# Patient Record
Sex: Female | Born: 1980
Health system: Southern US, Community
[De-identification: ages and names within clinical notes are randomized; demographics above are authoritative.]

## PROBLEM LIST (undated history)

## (undated) DIAGNOSIS — M5136 Other intervertebral disc degeneration, lumbar region: Secondary | ICD-10-CM

## (undated) DIAGNOSIS — G8929 Other chronic pain: Secondary | ICD-10-CM

## (undated) DIAGNOSIS — M549 Dorsalgia, unspecified: Secondary | ICD-10-CM

## (undated) DIAGNOSIS — M51369 Other intervertebral disc degeneration, lumbar region without mention of lumbar back pain or lower extremity pain: Secondary | ICD-10-CM

## (undated) HISTORY — PX: TUBAL LIGATION: SHX77

---

## 2001-12-03 ENCOUNTER — Inpatient Hospital Stay (HOSPITAL_COMMUNITY): Admission: AD | Admit: 2001-12-03 | Discharge: 2001-12-03 | Payer: Self-pay | Admitting: Obstetrics and Gynecology

## 2001-12-29 ENCOUNTER — Inpatient Hospital Stay (HOSPITAL_COMMUNITY): Admission: AD | Admit: 2001-12-29 | Discharge: 2001-12-29 | Payer: Self-pay | Admitting: Obstetrics and Gynecology

## 2002-01-02 ENCOUNTER — Inpatient Hospital Stay (HOSPITAL_COMMUNITY): Admission: AD | Admit: 2002-01-02 | Discharge: 2002-01-05 | Payer: Self-pay | Admitting: Obstetrics and Gynecology

## 2002-01-12 ENCOUNTER — Inpatient Hospital Stay (HOSPITAL_COMMUNITY): Admission: AD | Admit: 2002-01-12 | Discharge: 2002-01-12 | Payer: Self-pay | Admitting: Obstetrics and Gynecology

## 2002-10-19 ENCOUNTER — Other Ambulatory Visit: Admission: RE | Admit: 2002-10-19 | Discharge: 2002-10-19 | Payer: Self-pay | Admitting: Obstetrics and Gynecology

## 2003-11-06 ENCOUNTER — Other Ambulatory Visit: Admission: RE | Admit: 2003-11-06 | Discharge: 2003-11-06 | Payer: Self-pay | Admitting: Obstetrics and Gynecology

## 2004-05-11 ENCOUNTER — Other Ambulatory Visit: Admission: RE | Admit: 2004-05-11 | Discharge: 2004-05-11 | Payer: Self-pay | Admitting: Obstetrics and Gynecology

## 2005-06-22 ENCOUNTER — Other Ambulatory Visit: Admission: RE | Admit: 2005-06-22 | Discharge: 2005-06-22 | Payer: Self-pay | Admitting: Obstetrics and Gynecology

## 2005-06-25 ENCOUNTER — Ambulatory Visit (HOSPITAL_COMMUNITY): Admission: RE | Admit: 2005-06-25 | Discharge: 2005-06-25 | Payer: Self-pay | Admitting: Obstetrics and Gynecology

## 2005-07-02 ENCOUNTER — Inpatient Hospital Stay (HOSPITAL_COMMUNITY): Admission: AD | Admit: 2005-07-02 | Discharge: 2005-07-02 | Payer: Self-pay | Admitting: Obstetrics and Gynecology

## 2005-08-16 ENCOUNTER — Encounter: Admission: RE | Admit: 2005-08-16 | Discharge: 2005-08-16 | Payer: Self-pay | Admitting: Obstetrics and Gynecology

## 2005-11-18 ENCOUNTER — Inpatient Hospital Stay (HOSPITAL_COMMUNITY): Admission: AD | Admit: 2005-11-18 | Discharge: 2005-11-18 | Payer: Self-pay | Admitting: Obstetrics and Gynecology

## 2005-11-21 ENCOUNTER — Inpatient Hospital Stay (HOSPITAL_COMMUNITY): Admission: AD | Admit: 2005-11-21 | Discharge: 2005-11-21 | Payer: Self-pay | Admitting: Obstetrics and Gynecology

## 2005-11-24 IMAGING — US US RENAL
1 series · 14 of 25 positions shown · non-contrast
Comparison: none

CLINICAL DATA: Left flank pain. 
 RENAL/URINARY TRACT ULTRASOUND:
TECHNIQUE: Complete ultrasound examination of the urinary tract was performed including evaluation of the kidneys, renal collecting systems, and urinary bladder.

[Series 1: unknown · 0.19mm/px · 14 of 29 slices shown]
[im 1/29]
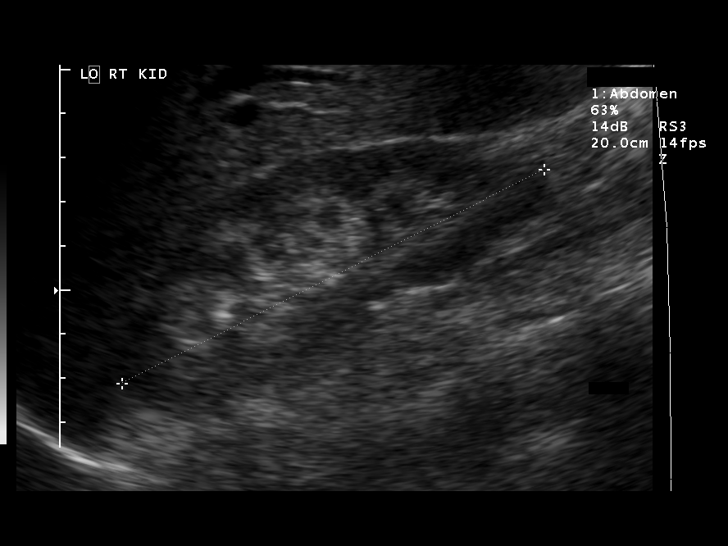
[im 3/29]
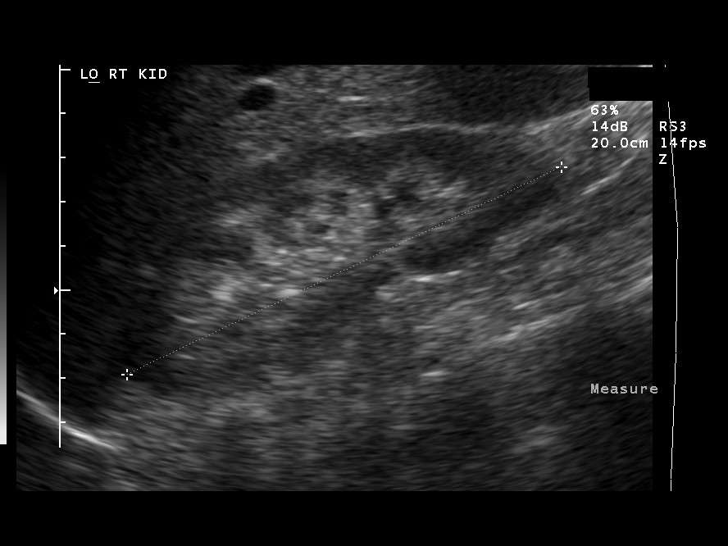
[im 5/29]
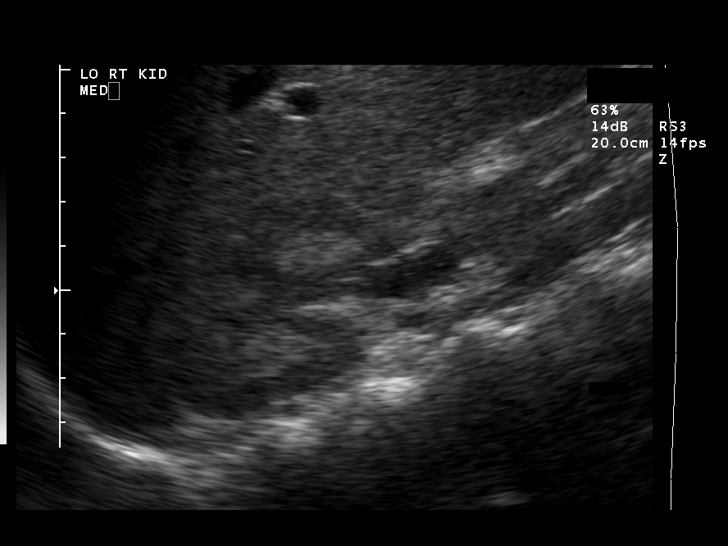
[im 8/29]
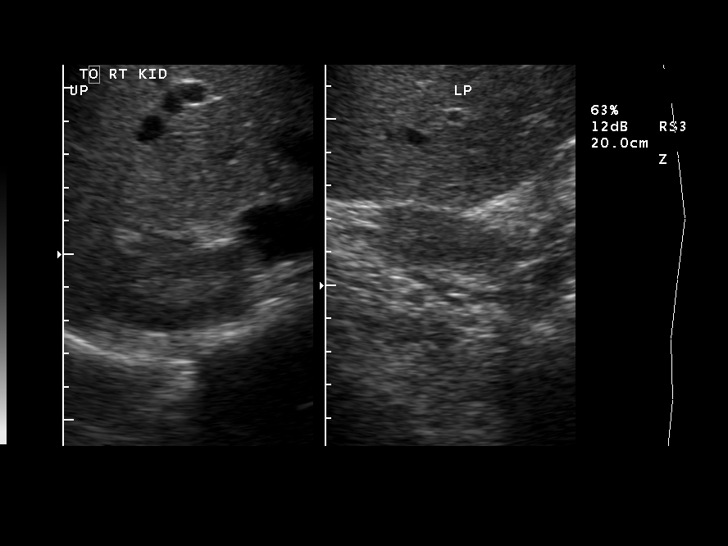
[im 10/29]
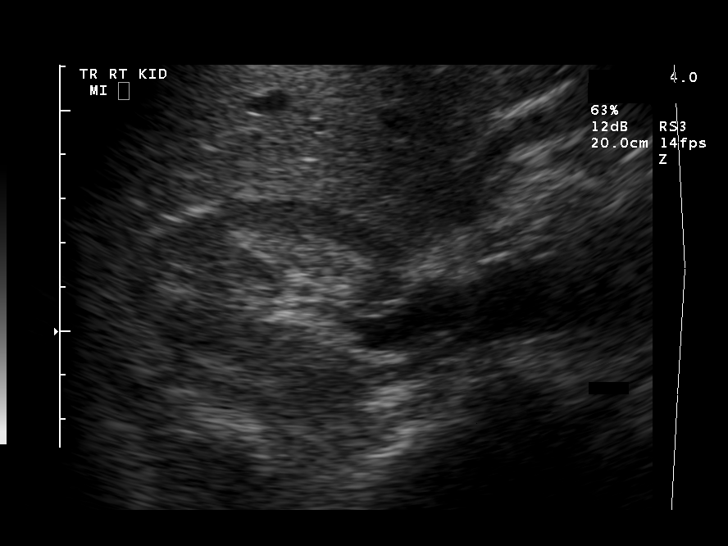
[im 11/29]
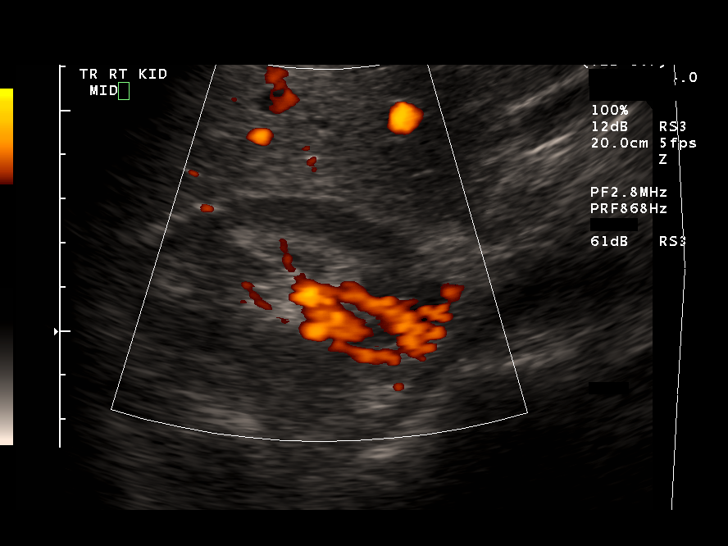
[im 13/29]
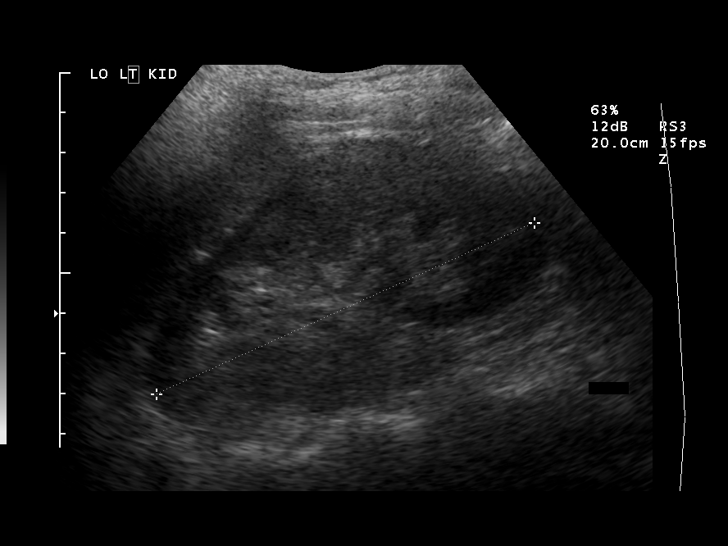
[im 16/29]
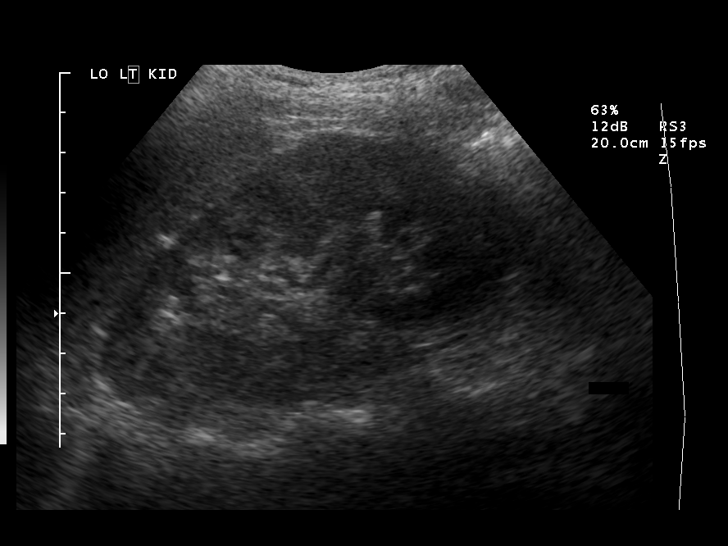
[im 18/29]
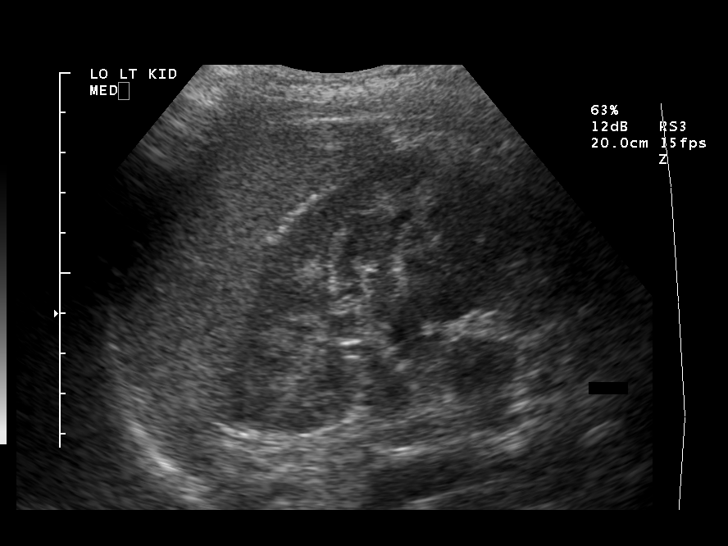
[im 19/29]
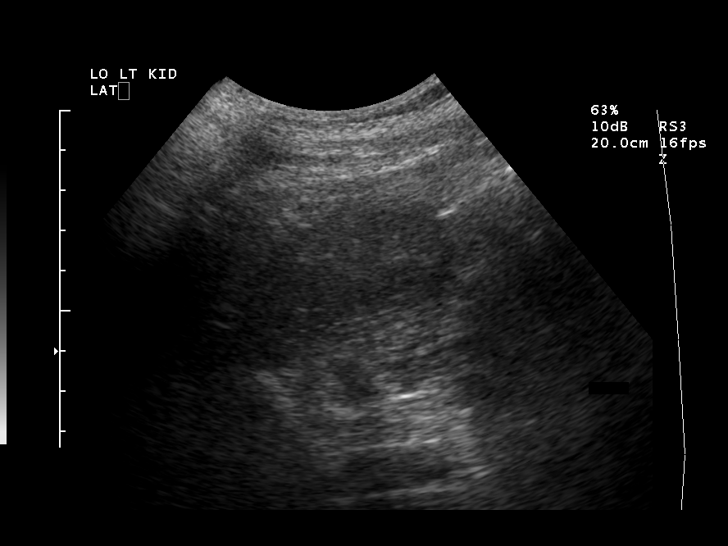
[im 22/29]
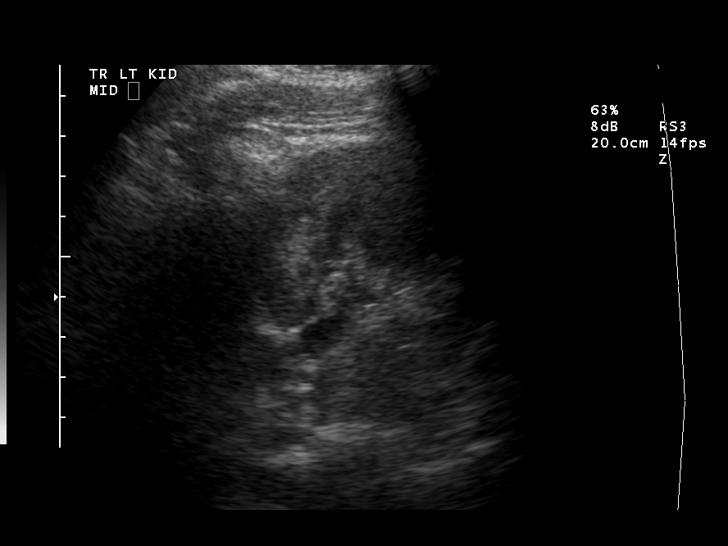
[im 24/29]
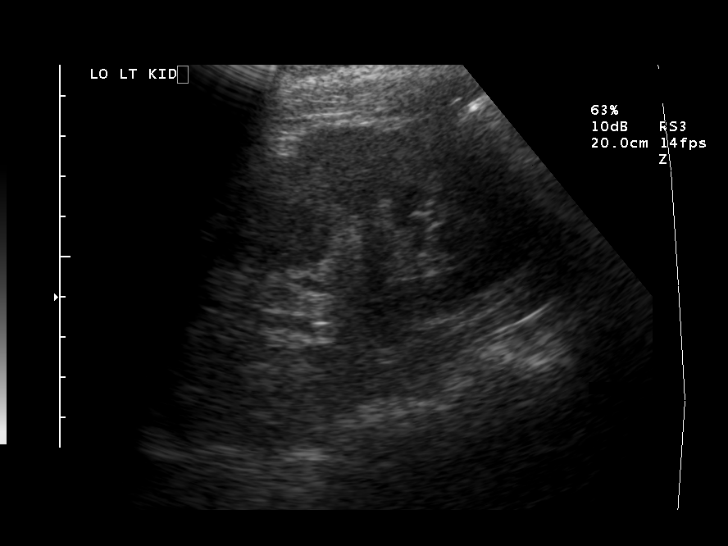
[im 26/29]
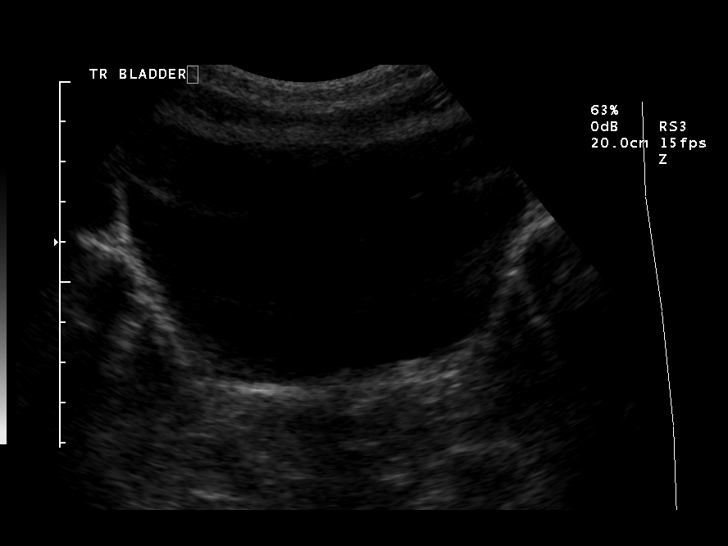
[im 29/29]
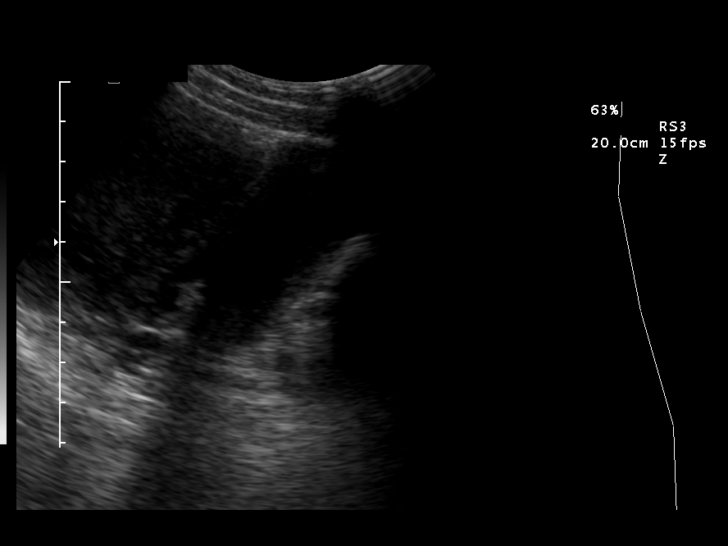

[14 of 25 positions shown; findings below may reference images not displayed]

FINDINGS: Kidneys bilaterally are normal in appearance and size without evidence of focal solid lesions, urinary calculi or calcifications, or hydronephrosis.  The bladder is normal for the degree of filling. 
 Right kidney measures 10.9 cm and the left kidney measures 11.2 cm in greatest longitudinal dimension.
IMPRESSION: Unremarkable renal ultrasound.

## 2006-02-08 ENCOUNTER — Inpatient Hospital Stay (HOSPITAL_COMMUNITY): Admission: AD | Admit: 2006-02-08 | Discharge: 2006-02-11 | Payer: Self-pay | Admitting: Obstetrics and Gynecology

## 2006-07-27 ENCOUNTER — Other Ambulatory Visit: Admission: RE | Admit: 2006-07-27 | Discharge: 2006-07-27 | Payer: Self-pay | Admitting: Obstetrics and Gynecology

## 2008-07-19 ENCOUNTER — Ambulatory Visit (HOSPITAL_COMMUNITY)
Admission: RE | Admit: 2008-07-19 | Discharge: 2008-07-19 | Payer: Self-pay | Admitting: Physical Medicine and Rehabilitation

## 2009-02-04 ENCOUNTER — Encounter: Admission: RE | Admit: 2009-02-04 | Discharge: 2009-02-04 | Payer: Self-pay | Admitting: Orthopedic Surgery

## 2009-04-28 ENCOUNTER — Emergency Department (HOSPITAL_BASED_OUTPATIENT_CLINIC_OR_DEPARTMENT_OTHER): Admission: EM | Admit: 2009-04-28 | Discharge: 2009-04-28 | Payer: Self-pay | Admitting: Emergency Medicine

## 2009-05-17 ENCOUNTER — Emergency Department (HOSPITAL_BASED_OUTPATIENT_CLINIC_OR_DEPARTMENT_OTHER): Admission: EM | Admit: 2009-05-17 | Discharge: 2009-05-17 | Payer: Self-pay | Admitting: Emergency Medicine

## 2009-05-17 ENCOUNTER — Ambulatory Visit: Payer: Self-pay | Admitting: Diagnostic Radiology

## 2009-10-31 ENCOUNTER — Encounter: Admission: RE | Admit: 2009-10-31 | Discharge: 2009-10-31 | Payer: Self-pay | Admitting: Neurosurgery

## 2010-01-01 ENCOUNTER — Ambulatory Visit (HOSPITAL_COMMUNITY): Admission: RE | Admit: 2010-01-01 | Discharge: 2010-01-01 | Payer: Self-pay | Admitting: Obstetrics and Gynecology

## 2010-12-27 LAB — CBC
HCT: 43.8 % (ref 36.0–46.0)
Hemoglobin: 15.2 g/dL — ABNORMAL HIGH (ref 12.0–15.0)
MCHC: 34.7 g/dL (ref 30.0–36.0)
MCV: 94.1 fL (ref 78.0–100.0)
Platelets: 357 10*3/uL (ref 150–400)
RBC: 4.66 MIL/uL (ref 3.87–5.11)
RDW: 11.3 % — ABNORMAL LOW (ref 11.5–15.5)
WBC: 6.8 10*3/uL (ref 4.0–10.5)

## 2010-12-27 LAB — HCG, SERUM, QUALITATIVE: Preg, Serum: NEGATIVE

## 2011-01-09 LAB — URINALYSIS, ROUTINE W REFLEX MICROSCOPIC
Bilirubin Urine: NEGATIVE
Glucose, UA: NEGATIVE mg/dL
Hgb urine dipstick: NEGATIVE
Ketones, ur: 15 mg/dL — AB
Nitrite: NEGATIVE
Protein, ur: NEGATIVE mg/dL
Specific Gravity, Urine: 1.026 (ref 1.005–1.030)
Urobilinogen, UA: 0.2 mg/dL (ref 0.0–1.0)
pH: 6 (ref 5.0–8.0)

## 2011-01-09 LAB — URINE MICROSCOPIC-ADD ON

## 2011-01-09 LAB — PREGNANCY, URINE: Preg Test, Ur: NEGATIVE

## 2011-01-27 ENCOUNTER — Emergency Department: Payer: Self-pay | Admitting: Emergency Medicine
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
## 2011-10-17 ENCOUNTER — Encounter (HOSPITAL_BASED_OUTPATIENT_CLINIC_OR_DEPARTMENT_OTHER): Payer: Self-pay | Admitting: Emergency Medicine

## 2011-10-17 ENCOUNTER — Emergency Department (HOSPITAL_BASED_OUTPATIENT_CLINIC_OR_DEPARTMENT_OTHER)
Admission: EM | Admit: 2011-10-17 | Discharge: 2011-10-17 | Disposition: A | Discharge status: Home / Self Care | Payer: Medicaid Other | Attending: Emergency Medicine | Admitting: Emergency Medicine

## 2011-10-17 ENCOUNTER — Emergency Department (INDEPENDENT_AMBULATORY_CARE_PROVIDER_SITE_OTHER): Payer: Medicaid Other

## 2011-10-17 DIAGNOSIS — R42 Dizziness and giddiness: Secondary | ICD-10-CM | POA: Insufficient documentation

## 2011-10-17 DIAGNOSIS — M549 Dorsalgia, unspecified: Secondary | ICD-10-CM | POA: Insufficient documentation

## 2011-10-17 DIAGNOSIS — G8929 Other chronic pain: Secondary | ICD-10-CM

## 2011-10-17 DIAGNOSIS — Z79899 Other long term (current) drug therapy: Secondary | ICD-10-CM | POA: Insufficient documentation

## 2011-10-17 HISTORY — DX: Other intervertebral disc degeneration, lumbar region without mention of lumbar back pain or lower extremity pain: M51.369

## 2011-10-17 HISTORY — DX: Other chronic pain: G89.29

## 2011-10-17 HISTORY — DX: Dorsalgia, unspecified: M54.9

## 2011-10-17 HISTORY — DX: Other intervertebral disc degeneration, lumbar region: M51.36

## 2011-10-17 LAB — URINALYSIS, ROUTINE W REFLEX MICROSCOPIC
Bilirubin Urine: NEGATIVE
Glucose, UA: NEGATIVE mg/dL
Hgb urine dipstick: NEGATIVE
Ketones, ur: NEGATIVE mg/dL
Nitrite: NEGATIVE
Protein, ur: NEGATIVE mg/dL
Specific Gravity, Urine: 1.01 (ref 1.005–1.030)
Urobilinogen, UA: 1 mg/dL (ref 0.0–1.0)
pH: 6.5 (ref 5.0–8.0)

## 2011-10-17 LAB — URINE MICROSCOPIC-ADD ON

## 2011-10-17 NOTE — ED Notes (Signed)
No rx given - d/c with ride

## 2011-10-17 NOTE — ED Provider Notes (Signed)
History     CSN: [id number]  Arrival date & time 10/17/11  1150   First MD Initiated Contact with Patient 10/17/11 1253      Chief Complaint  Patient presents with  . Back Pain  . Dizziness    (Consider location/radiation/quality/duration/timing/severity/associated sxs/prior treatment) Patient is a 31 y.o. female presenting with back pain. The history is provided by the patient. No language interpreter was used.  Back Pain  This is a chronic problem. The current episode started 2 days ago. The problem occurs constantly. The problem has not changed since onset.The pain is associated with twisting. The pain is present in the lumbar spine. The quality of the pain is described as aching. The pain radiates to the left thigh. The pain is severe. The symptoms are aggravated by bending, twisting and certain positions. The pain is the same all the time. Pertinent negatives include no fever, no numbness, no abdominal swelling, no bowel incontinence, no dysuria, no pelvic pain, no tingling and no weakness. Treatments tried: pt states that she goes to chronic pain management.    Past Medical History  Diagnosis Date  . Back pain, chronic   . Degenerative disc disease, lumbar     History reviewed. No pertinent past surgical history.  History reviewed. No pertinent family history.  History  Substance Use Topics  . Smoking status: Never Smoker   . Smokeless tobacco: Not on file  . Alcohol Use: No    OB History    Grav Para Term Preterm Abortions TAB SAB Ect Mult Living                  Review of Systems  Constitutional: Negative for fever.  Gastrointestinal: Negative for bowel incontinence.  Genitourinary: Negative for dysuria and pelvic pain.  Musculoskeletal: Positive for back pain.  Neurological: Negative for tingling, weakness and numbness.  All other systems reviewed and are negative.    Allergies  Ibuprofen; Iron; Sulfa antibiotics; and Tramadol  Home Medications    Current Outpatient Rx  Name Route Sig Dispense Refill  . HYDROCODONE-ACETAMINOPHEN 10-325 MG PO TABS Oral Take 1 tablet by mouth 2 (two) times daily.    Marland Kitchen HYDROMORPHONE HCL ER 12 MG PO TB24 Oral Take 12 mg by mouth daily.    Marland Kitchen OLANZAPINE-FLUOXETINE HCL 3-25 MG PO CAPS Oral Take 1 capsule by mouth every evening.    Marland Kitchen PREGABALIN 200 MG PO CAPS Oral Take 200 mg by mouth 3 (three) times daily.    Marland Kitchen TIZANIDINE HCL 6 MG PO CAPS Oral Take 6 mg by mouth 3 (three) times daily.      BP 97/62  Pulse 71  Temp(Src) 97.6 F (36.4 C) (Oral)  Resp 16  SpO2 97%  LMP 10/11/2011  Physical Exam  Nursing note and vitals reviewed. Constitutional: She is oriented to person, place, and time. She appears well-developed and well-nourished.  HENT:  Head: Normocephalic and atraumatic.  Cardiovascular: Normal rate and regular rhythm.   Pulmonary/Chest: Effort normal and breath sounds normal.  Abdominal: Soft. Bowel sounds are normal.  Musculoskeletal:       Lumbar back: She exhibits tenderness and bony tenderness.  Neurological: She is alert and oriented to person, place, and time.  Skin: Skin is warm and dry.  Psychiatric: She has a normal mood and affect.    ED Course  Procedures (including critical care time)  Labs Reviewed  URINALYSIS, ROUTINE W REFLEX MICROSCOPIC - Abnormal; Notable for the following:    APPearance CLOUDY (*)  Leukocytes, UA SMALL (*)    All other components within normal limits  URINE MICROSCOPIC-ADD ON - Abnormal; Notable for the following:    Squamous Epithelial / LPF MANY (*)    Bacteria, UA MANY (*)    All other components within normal limits  PREGNANCY, URINE   Dg Lumbar Spine Complete  10/17/2011  *RADIOLOGY REPORT*  Clinical Data: Back pain.  LUMBAR SPINE - COMPLETE 4+ VIEW  Comparison: MRI lumbar spine 10/31/2009.  Findings: Vertebral body height and alignment are normal. Intervertebral disc space height is maintained.  No pars interarticularis defect is  identified.  IMPRESSION: Negative exam.  Original Report Authenticated By: Bernadene Bell. D'ALESSIO, M.D.     1. Chronic back pain       MDM  No acute findings noted on x-ray:pt has medications at home       Teressa Lower, NP 10/17/11 1511

## 2011-10-17 NOTE — ED Notes (Signed)
Pt has chronic back pain but has been worse for three days.  Pt also states that today when she stands, she gets dizzy.  No LOC.  She states she sits down and dizziness resolves.  No known injury.  Pt goes to [facility].

## 2011-10-19 NOTE — ED Provider Notes (Signed)
Medical screening examination/treatment/procedure(s) were performed by non-physician practitioner and as supervising physician I was immediately available for consultation/collaboration.  Juliet Rude. Rubin Payor, MD 10/19/11 [phone]

## 2013-05-27 ENCOUNTER — Encounter (HOSPITAL_BASED_OUTPATIENT_CLINIC_OR_DEPARTMENT_OTHER): Payer: Self-pay | Admitting: *Deleted

## 2013-05-27 ENCOUNTER — Emergency Department (HOSPITAL_BASED_OUTPATIENT_CLINIC_OR_DEPARTMENT_OTHER): Payer: Self-pay

## 2013-05-27 ENCOUNTER — Emergency Department (HOSPITAL_BASED_OUTPATIENT_CLINIC_OR_DEPARTMENT_OTHER)
Admission: EM | Admit: 2013-05-27 | Discharge: 2013-05-27 | Disposition: A | Discharge status: Home / Self Care | Payer: Self-pay | Attending: Emergency Medicine | Admitting: Emergency Medicine

## 2013-05-27 DIAGNOSIS — R109 Unspecified abdominal pain: Secondary | ICD-10-CM | POA: Insufficient documentation

## 2013-05-27 DIAGNOSIS — Z3202 Encounter for pregnancy test, result negative: Secondary | ICD-10-CM | POA: Insufficient documentation

## 2013-05-27 DIAGNOSIS — Z79899 Other long term (current) drug therapy: Secondary | ICD-10-CM | POA: Insufficient documentation

## 2013-05-27 DIAGNOSIS — R300 Dysuria: Secondary | ICD-10-CM | POA: Insufficient documentation

## 2013-05-27 DIAGNOSIS — Z8739 Personal history of other diseases of the musculoskeletal system and connective tissue: Secondary | ICD-10-CM | POA: Insufficient documentation

## 2013-05-27 DIAGNOSIS — Z87442 Personal history of urinary calculi: Secondary | ICD-10-CM | POA: Insufficient documentation

## 2013-05-27 DIAGNOSIS — G8929 Other chronic pain: Secondary | ICD-10-CM | POA: Insufficient documentation

## 2013-05-27 LAB — CBC WITH DIFFERENTIAL/PLATELET
Basophils Relative: 0 % (ref 0–1)
Eosinophils Absolute: 0.1 10*3/uL (ref 0.0–0.7)
Eosinophils Relative: 2 % (ref 0–5)
MCH: 32.1 pg (ref 26.0–34.0)
MCHC: 34.8 g/dL (ref 30.0–36.0)
MCV: 92.2 fL (ref 78.0–100.0)
Monocytes Relative: 7 % (ref 3–12)
Neutrophils Relative %: 67 % (ref 43–77)
Platelets: 277 10*3/uL (ref 150–400)

## 2013-05-27 LAB — URINALYSIS, ROUTINE W REFLEX MICROSCOPIC
Bilirubin Urine: NEGATIVE
Glucose, UA: NEGATIVE mg/dL
Hgb urine dipstick: NEGATIVE
Ketones, ur: 40 mg/dL — AB
Nitrite: NEGATIVE
Protein, ur: NEGATIVE mg/dL
Specific Gravity, Urine: 1.027 (ref 1.005–1.030)
Urobilinogen, UA: 1 mg/dL (ref 0.0–1.0)
pH: 5.5 (ref 5.0–8.0)

## 2013-05-27 LAB — URINE MICROSCOPIC-ADD ON

## 2013-05-27 LAB — BASIC METABOLIC PANEL
BUN: 13 mg/dL (ref 6–23)
Calcium: 9.5 mg/dL (ref 8.4–10.5)
GFR calc Af Amer: >90 mL/min (ref >90)
GFR calc non Af Amer: >90 mL/min (ref >90)
Potassium: 3.3 mEq/L — ABNORMAL LOW (ref 3.5–5.1)

## 2013-05-27 LAB — PREGNANCY, URINE: Preg Test, Ur: NEGATIVE

## 2013-05-27 MED ORDER — ACETAMINOPHEN 325 MG PO TABS: 650.0000 mg | ORAL_TABLET | Freq: Four times a day (QID) | ORAL | Status: DC | PRN

## 2013-05-27 MED ORDER — ONDANSETRON HCL 4 MG/2ML IJ SOLN
4.0000 mg | Freq: Once | INTRAMUSCULAR | Status: AC
  Administered 2013-05-27: 4 mg via INTRAVENOUS
  Filled 2013-05-27: qty 2

## 2013-05-27 MED ORDER — CYCLOBENZAPRINE HCL 10 MG PO TABS: 10.0000 mg | ORAL_TABLET | Freq: Two times a day (BID) | ORAL | Status: DC | PRN

## 2013-05-27 MED ORDER — MORPHINE SULFATE 4 MG/ML IJ SOLN
4.0000 mg | Freq: Once | INTRAMUSCULAR | Status: AC
  Administered 2013-05-27: 4 mg via INTRAVENOUS
  Filled 2013-05-27: qty 1

## 2013-05-27 MED ORDER — SODIUM CHLORIDE 0.9 % IV BOLUS (SEPSIS)
1000.0000 mL | Freq: Once | INTRAVENOUS | Status: AC
  Administered 2013-05-27: 1000 mL via INTRAVENOUS

## 2013-05-27 NOTE — ED Provider Notes (Signed)
CSN: [id number]     Arrival date & time 05/27/13  1556 History     First MD Initiated Contact with Patient 05/27/13 1606     Chief Complaint  Patient presents with  . Flank Pain   (Consider location/radiation/quality/duration/timing/severity/associated sxs/prior Treatment) HPI Comments: Patient is a 32 year old female with a past medical history of chronic back pain and kidney stones who presents with bilateral flank pain for the past 2 weeks. The pain is located in her bilateral flanks and does not radiate. The pain is described as aching and severe. The pain started gradually and progressively worsened since the onset. No alleviating/aggravating factors. Patient reports history of kidney stones which have felt like this. The patient has tried nothing for symptoms without relief. Associated symptoms include dysuria. Patient denies fever, headache, NVD, chest pain, SOB, constipation, abnormal vaginal bleeding/discharge.      Past Medical History  Diagnosis Date  . Back pain, chronic   . Degenerative disc disease, lumbar    History reviewed. No pertinent past surgical history. No family history on file. History  Substance Use Topics  . Smoking status: Never Smoker   . Smokeless tobacco: Not on file  . Alcohol Use: No   OB History   Grav Para Term Preterm Abortions TAB SAB Ect Mult Living                 Review of Systems  Genitourinary: Positive for dysuria and flank pain.  All other systems reviewed and are negative.    Allergies  Ibuprofen; Iron; Sulfa antibiotics; and Tramadol  Home Medications   Current Outpatient Rx  Name  Route  Sig  Dispense  Refill  . HYDROcodone-acetaminophen (NORCO) 10-325 MG per tablet   Oral   Take 1 tablet by mouth 2 (two) times daily.         Marland Kitchen HYDROmorphone HCl (EXALGO) 12 MG TB24   Oral   Take 12 mg by mouth daily.         Marland Kitchen olanzapine-fluoxetine (SYMBYAX) 3-25 MG per capsule   Oral   Take 1 capsule by mouth every evening.         . pregabalin (LYRICA) 200 MG capsule   Oral   Take 200 mg by mouth 3 (three) times daily.         . tizanidine (ZANAFLEX) 6 MG capsule   Oral   Take 6 mg by mouth 3 (three) times daily.          BP 122/70  Pulse 73  Temp(Src) 97.8 F (36.6 C) (Oral)  Resp 18  Ht 5\' 3"  (1.6 m)  Wt 140 lb (63.504 kg)  BMI 24.81 kg/m2  SpO2 100% Physical Exam  Nursing note and vitals reviewed. Constitutional: She is oriented to person, place, and time. She appears well-developed and well-nourished. No distress.  HENT:  Head: Normocephalic and atraumatic.  Eyes: Conjunctivae are normal.  Neck: Normal range of motion.  Cardiovascular: Normal rate and regular rhythm.  Exam reveals no gallop and no friction rub.   No murmur heard. Pulmonary/Chest: Effort normal and breath sounds normal. She has no wheezes. She has no rales. She exhibits no tenderness.  Abdominal: Soft. She exhibits no distension. There is no tenderness. There is no rebound and no guarding.  Genitourinary:  Left CVA tenderness.   Musculoskeletal: Normal range of motion.  Neurological: She is alert and oriented to person, place, and time. Coordination normal.  Speech is goal-oriented. Moves limbs without ataxia.  Skin: Skin is warm and dry.  Psychiatric: She has a normal mood and affect. Her behavior is normal.    ED Course   Procedures (including critical care time)  Labs Reviewed  URINALYSIS, ROUTINE W REFLEX MICROSCOPIC - Abnormal; Notable for the following:    APPearance CLOUDY (*)    Ketones, ur 40 (*)    Leukocytes, UA SMALL (*)    All other components within normal limits  CBC WITH DIFFERENTIAL - Abnormal; Notable for the following:    RDW 11.3 (*)    All other components within normal limits  BASIC METABOLIC PANEL - Abnormal; Notable for the following:    Potassium 3.3 (*)    All other components within normal limits  URINE MICROSCOPIC-ADD ON - Abnormal; Notable for the following:    Squamous  Epithelial / LPF FEW (*)    Bacteria, UA FEW (*)    Crystals CA OXALATE CRYSTALS (*)    All other components within normal limits  URINE CULTURE  PREGNANCY, URINE   Ct Abdomen Pelvis Wo Contrast  05/27/2013   CLINICAL DATA:  Right flank pain for 2 weeks. History of renal calculi and tubal ligation.  EXAM: CT ABDOMEN AND PELVIS WITHOUT CONTRAST  TECHNIQUE: Multidetector CT imaging of the abdomen and pelvis was performed following the standard protocol without intravenous contrast.  COMPARISON:  CT urogram 09/11/2009.  FINDINGS: There is interval improved aeration of the lung bases. There is mild residual left lower lobe scarring or atelectasis. No significant pleural or pericardial effusion is present.  There are probable tiny nonobstructing caliceal calculi on the left, best seen on coronal images 49 and 54. There is no evidence of right-sided renal calculus. There is no hydronephrosis, perinephric soft tissue stranding or evidence of ureteral calculus. Multiple pelvic phleboliths are present bilaterally. The bladder appears unremarkable.  As evaluated in the noncontrast state, the liver, gallbladder, spleen, pancreas and adrenal glands appear normal.  Stool is present throughout the colon. The appendix appears normal. No inflammatory changes are identified. Retroverted uterus is prominent. There is no evidence of adnexal mass.  IMPRESSION: No evidence of ureteral calculus, hydronephrosis or acute process. Probable tiny nonobstructing left renal calculi.   Electronically Signed   By: Roxy Horseman   On: 05/27/2013 17:48   1. Flank pain     MDM  5:16 PM Labs and urinalysis unremarkable for acute changes. Vitals stable and patient afebrile. Patient reports relief with morphine. Patient will have CT abdomen pelvis to rule out kidney stone.   6:22 PM CT unremarkable for uretal stone. Patient acute infectious process unlikely at this time. I will treat patient symptomatically with instructions to return  with worsening or concerning symptoms. Vitals stable and patient afebrile.   Emilia Beck, PA-C 05/27/13 1825

## 2013-05-27 NOTE — ED Notes (Signed)
Patient with two weeks of right flank pain.  Patient has history of kidney stones.  States that it also hurts when she urinates.

## 2013-05-27 NOTE — ED Notes (Signed)
Patient transported to CT 

## 2013-05-29 LAB — URINE CULTURE

## 2013-05-31 NOTE — ED Provider Notes (Signed)
Medical screening examination/treatment/procedure(s) were performed by non-physician practitioner and as supervising physician I was immediately available for consultation/collaboration.  Maheen Cwikla, MD 05/31/13 0720 

## 2013-06-08 ENCOUNTER — Encounter: Payer: Self-pay | Admitting: *Deleted

## 2013-08-09 ENCOUNTER — Other Ambulatory Visit: Payer: Self-pay

## 2014-02-13 ENCOUNTER — Ambulatory Visit (INDEPENDENT_AMBULATORY_CARE_PROVIDER_SITE_OTHER): Payer: 59 | Admitting: Neurology

## 2014-02-13 ENCOUNTER — Encounter: Payer: Self-pay | Admitting: Neurology

## 2014-02-13 VITALS — BP 124/78 | HR 68 | Ht 62.99 in | Wt 130.4 lb

## 2014-02-13 DIAGNOSIS — R404 Transient alteration of awareness: Secondary | ICD-10-CM
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
## 2014-02-13 NOTE — Patient Instructions (Addendum)
1. MRI brain with and without contrast 03/01/14 at [place] 9:45am 2. Routine EEG 3. Schedule for 48-hour EEG 4. As per [place] driving laws, for any episode of loss of awareness or consciousness, one should not drive until 6 months event-free

## 2014-02-14 ENCOUNTER — Telehealth: Payer: Self-pay | Admitting: Neurology

## 2014-02-14 NOTE — Telephone Encounter (Signed)
MRI has been r/s. Pt has questions, Please call [phone] / Sherri S.

## 2014-02-15 NOTE — Telephone Encounter (Signed)
Unable to reach patient will try again later 

## 2014-02-19 ENCOUNTER — Telehealth: Payer: Self-pay | Admitting: Neurology

## 2014-02-19 NOTE — Telephone Encounter (Signed)
Pt's boss inform her that her MRI is not covered under her health insurance if the MRI is done at a hospital. She states that her health insurance will only cover the MRI if it is being done at a private facility such [facility].  Please call Pt.

## 2014-02-19 NOTE — Telephone Encounter (Signed)
Patient notified

## 2014-02-19 NOTE — Telephone Encounter (Signed)
Unable to reach patient.

## 2014-02-19 NOTE — Telephone Encounter (Signed)
Spoke with patient she would like to reschedule  With [facility] for insurance reasons. [facility] to call [facility] to schedule to her availability.

## 2014-03-01 ENCOUNTER — Ambulatory Visit (HOSPITAL_COMMUNITY): Payer: 59

## 2014-03-01 ENCOUNTER — Inpatient Hospital Stay: Admission: RE | Admit: 2014-03-01 | Payer: 59 | Source: Ambulatory Visit

## 2014-03-12 ENCOUNTER — Other Ambulatory Visit: Payer: 59

## 2014-03-13 ENCOUNTER — Ambulatory Visit (INDEPENDENT_AMBULATORY_CARE_PROVIDER_SITE_OTHER): Payer: 59 | Admitting: Neurology

## 2014-03-13 DIAGNOSIS — R404 Transient alteration of awareness: Secondary | ICD-10-CM

## 2014-03-14 NOTE — Progress Notes (Signed)
Patient came in for EEG. See Procedure notes for EEG results.  

## 2014-03-14 NOTE — Procedures (Signed)
ELECTROENCEPHALOGRAM REPORT  Date of Study: 03/13/2014  Patient's Name: Kaitlin Perez MRN: [id number] Date of Birth: 08-Apr-1981  Referring Provider: Dr. Patrcia Dolly  Clinical History: This is a 33 year old woman with a 10-year history where she describes feeling like she is in a dream, followed by staring and unresponsiveness. She has stuttering and significant speech difficulties after the events.  Medications: No antiepileptic medication  Technical Summary: A multichannel digital EEG recording measured by the international 10-20 system with electrodes applied with paste and impedances below 5000 ohms performed in our laboratory with EKG monitoring in an awake and asleep patient.  Hyperventilation and photic stimulation were performed.  The digital EEG was referentially recorded, reformatted, and digitally filtered in a variety of bipolar and referential montages for optimal display.  Spike detection software was employed.  Description: The patient is awake and asleep during the recording.  During maximal wakefulness, there is a symmetric, medium voltage 9 Hz posterior dominant rhythm that attenuates with eye opening.  The record is symmetric.  During drowsiness and sleep, there is an increase in theta slowing of the background.  Vertex waves and symmetric sleep spindles were seen.  Hyperventilation and photic stimulation did not elicit any abnormalities.  There were no epileptiform discharges or electrographic seizures seen.    EKG lead was unremarkable.  Impression: This awake and asleep EEG is normal.    Clinical Correlation: A normal EEG does not exclude a clinical diagnosis of epilepsy.  If further clinical questions remain, prolonged EEG may be helpful.  Clinical correlation is advised.   Patrcia Dolly, M.D.

## 2014-03-20 ENCOUNTER — Other Ambulatory Visit: Payer: 59

## 2014-03-21 ENCOUNTER — Ambulatory Visit (INDEPENDENT_AMBULATORY_CARE_PROVIDER_SITE_OTHER): Payer: 59 | Admitting: Neurology

## 2014-03-21 DIAGNOSIS — R404 Transient alteration of awareness: Secondary | ICD-10-CM

## 2014-03-27 NOTE — Procedures (Signed)
ELECTROENCEPHALOGRAM REPORT  Dates of Recording: 03/21/2014 to 03/22/2014  Patient's Name: Kaitlin Perez MRN: [id number] Date of Birth: [birth year]  Referring Pike [place]: Dr. Patrcia DollyKaren Aquino  Procedure: 24-hour ambulatory EEG  History: This is a 33 year old woman with a 10-year history where she describes feeling like she is in a dream, followed by staring and unresponsiveness. She has stuttering and  significant speech difficulties after the events  Medications: No anti-epileptic medication  Technical Summary: This is a 24-hour multichannel digital EEG recording measured by the international 10-20 system with electrodes applied with paste and impedances below 5000 ohms performed as portable with EKG monitoring.  The digital EEG was referentially recorded, reformatted, and digitally filtered in a variety of bipolar and referential montages for optimal display.    DESCRIPTION OF RECORDING: During maximal wakefulness, the background activity consisted of a symmetric 9 Hz posterior dominant rhythm which was reactive to eye opening.  There were no epileptiform discharges or focal slowing seen in wakefulness.  During the recording, the patient progresses through wakefulness, drowsiness, and Stage 2 sleep.  Again, there were no epileptiform discharges seen.  Events: The patient did not complete the diary. There were no symptoms reported.  There were no electrographic seizures seen.  EKG lead was unremarkable.  IMPRESSION: This 24-hour ambulatory EEG study is normal.    CLINICAL CORRELATION: A normal EEG does not exclude a clinical diagnosis of epilepsy. Typical events were not captured, patient did not complete diary.  If further clinical questions remain, inpatient video EEG monitoring may be helpful.   Patrcia DollyKaren Aquino, M.D.

## 2014-03-29 ENCOUNTER — Telehealth: Payer: Self-pay | Admitting: Neurology

## 2014-03-29 NOTE — Telephone Encounter (Signed)
Pt calling for EEG results. Please call [phone] / Sherri S.

## 2014-04-01 NOTE — Telephone Encounter (Signed)
Unable to reach patient will try again later 

## 2014-04-02 NOTE — Telephone Encounter (Signed)
Patient notified

## 2014-04-12 ENCOUNTER — Ambulatory Visit: Payer: 59 | Admitting: Neurology

## 2014-04-15 ENCOUNTER — Telehealth: Payer: Self-pay | Admitting: Neurology

## 2014-04-15 ENCOUNTER — Encounter: Payer: Self-pay | Admitting: Neurology

## 2014-04-15 NOTE — Telephone Encounter (Signed)
Pt no showed 04/12/14 follow up appt w/ Dr. Karel JarvisAquino. No show letter mailed to pt / Sherri S.

## 2014-09-18 NOTE — Progress Notes (Signed)
Duplicate. See other EEG report.

## 2015-11-27 ENCOUNTER — Emergency Department (HOSPITAL_BASED_OUTPATIENT_CLINIC_OR_DEPARTMENT_OTHER)
Admission: EM | Admit: 2015-11-27 | Discharge: 2015-11-27 | Disposition: A | Discharge status: Home / Self Care | Payer: Worker's Compensation | Attending: Emergency Medicine | Admitting: Emergency Medicine

## 2015-11-27 ENCOUNTER — Emergency Department (HOSPITAL_BASED_OUTPATIENT_CLINIC_OR_DEPARTMENT_OTHER): Payer: Worker's Compensation

## 2015-11-27 ENCOUNTER — Encounter (HOSPITAL_BASED_OUTPATIENT_CLINIC_OR_DEPARTMENT_OTHER): Payer: Self-pay | Admitting: Emergency Medicine

## 2015-11-27 DIAGNOSIS — S46012A Strain of muscle(s) and tendon(s) of the rotator cuff of left shoulder, initial encounter: Secondary | ICD-10-CM

## 2015-11-27 DIAGNOSIS — X500XXA Overexertion from strenuous movement or load, initial encounter: Secondary | ICD-10-CM | POA: Insufficient documentation

## 2015-11-27 DIAGNOSIS — Z8739 Personal history of other diseases of the musculoskeletal system and connective tissue: Secondary | ICD-10-CM | POA: Diagnosis not present

## 2015-11-27 DIAGNOSIS — Y9289 Other specified places as the place of occurrence of the external cause: Secondary | ICD-10-CM | POA: Diagnosis not present

## 2015-11-27 DIAGNOSIS — G8929 Other chronic pain: Secondary | ICD-10-CM | POA: Insufficient documentation

## 2015-11-27 DIAGNOSIS — S4992XA Unspecified injury of left shoulder and upper arm, initial encounter: Secondary | ICD-10-CM | POA: Diagnosis present

## 2015-11-27 DIAGNOSIS — Y990 Civilian activity done for income or pay: Secondary | ICD-10-CM | POA: Diagnosis not present

## 2015-11-27 DIAGNOSIS — S43422A Sprain of left rotator cuff capsule, initial encounter: Secondary | ICD-10-CM | POA: Diagnosis not present

## 2015-11-27 DIAGNOSIS — Y9389 Activity, other specified: Secondary | ICD-10-CM | POA: Diagnosis not present

## 2015-11-27 MED ORDER — CYCLOBENZAPRINE HCL 5 MG PO TABS: 5.0000 mg | ORAL_TABLET | Freq: Three times a day (TID) | ORAL | Status: DC | PRN

## 2015-11-27 NOTE — ED Notes (Signed)
Pt reports injuryed shoulder at work 2 days ago while carrying ackward shaped object seen at [employer] and given vicodin 5-325 mg w/o relief.

## 2015-11-27 NOTE — Discharge Instructions (Signed)
Continue vicodin for pain. Do NOT drive with it.   Take flexeril for muscle spasms.  Use sling for comfort.   See orthopedic doctor for follow up (2 numbers here, one in this building and another in [place]).  Return to ER if you have worse pain, unable to move the shoulder, fevers

## 2015-11-27 NOTE — ED Provider Notes (Signed)
CSN: [id number]     Arrival date & time 11/27/15  0612 History   First MD Initiated Contact with Patient 11/27/15 [phone]     Chief Complaint  Patient presents with  . Shoulder Injury     (Consider location/radiation/quality/duration/timing/severity/associated sxs/prior Treatment) The history is provided by the patient.  Tenika A Coriz is a 35 y.o. female history of degenerative disc disease who presented with left shoulder pain after injury. Patient states that she was at work 2 days ago, she pulled something heavy and then felt severe pain in the left shoulder. She went to [facility] at that time an x-ray those are remarkable and was given a sling and Vicodin. She states that the sling goes over her left shoulder and has a lot of pain with it. She states that she has some spasms of the left shoulder. Also states that the Vicodin has not helped her with pain and has no additional injuries.     Past Medical History  Diagnosis Date  . Back pain, chronic   . Degenerative disc disease, lumbar    History reviewed. No pertinent past surgical history. History reviewed. No pertinent family history. Social History  Substance Use Topics  . Smoking status: Never Smoker   . Smokeless tobacco: None  . Alcohol Use: No   OB History    No data available     Review of Systems  Musculoskeletal:       L shoulder pain   All other systems reviewed and are negative.     Allergies  Flomax; Ibuprofen; Iron; Sulfa antibiotics; Toradol; and Tramadol  Home Medications   Prior to Admission medications   Medication Sig Start Date End Date Taking? Authorizing Provider  traMADol (ULTRAM) 50 MG tablet Take 50 mg by mouth every 6 (six) hours as needed.    Historical Provider, MD   BP 126/77 mmHg  Pulse 82  Temp(Src) 98.3 F (36.8 C) (Oral)  SpO2 100%  LMP 11/24/2015 Physical Exam  Constitutional: She is oriented to person, place, and time.  Uncomfortable   HENT:  Head: Normocephalic.   Eyes: Conjunctivae are normal. Pupils are equal, round, and reactive to light.  Neck: Normal range of motion. Neck supple.  Cardiovascular: Normal rate, regular rhythm and normal heart sounds.   Pulmonary/Chest: Effort normal and breath sounds normal. No respiratory distress. She has no wheezes. She has no rales.  Abdominal: Bowel sounds are normal. She exhibits no distension. There is no tenderness. There is no rebound.  Musculoskeletal:  L shoulder dec ROM but no swelling. Spasms L trapezius muscle. Nl ROM L elbow. 2+ pulses LUE. Nl hand grasp   Neurological: She is alert and oriented to person, place, and time.  Skin: Skin is warm and dry.  Psychiatric: She has a normal mood and affect. Her behavior is normal. Judgment and thought content normal.  Nursing note and vitals reviewed.   ED Course  Procedures (including critical care time) Labs Review Labs Reviewed - No data to display  Imaging Review Dg Shoulder Left  11/27/2015  CLINICAL DATA:  Left shoulder pain.  Limited movement. EXAM: LEFT SHOULDER - 2+ VIEW COMPARISON:  11/25/2015 FINDINGS: There is a linear calcification along the superior aspect of the left glenoid. This calcification could be within the labrum and related to an prior injury. Left shoulder is located without a fracture. Left AC joint is intact. Visualized left ribs are intact. IMPRESSION: No acute bone abnormality. Linear calcification in the region of the left  labrum. Findings are nonspecific but could be associated with prior injury. Electronically Signed   By: Richarda Overlie M.D.   On: 11/27/2015 07:19   I have personally reviewed and evaluated these images and lab results as part of my medical decision-making.   EKG Interpretation None      MDM   Final diagnoses:  None   Maryssa A Carmon is a 35 y.o. female here with L shoulder injury 2 days ago. Dec ROM from pain, no obvious swelling or deformity and neurovascular intact. Xray showed no fracture but has  possible L labral injury. I think likely rotator cuff injury. Will give proper sling. She already has vicodin. Can add flexeril and have her see ortho outpatient.      Richardean Canal, MD 11/27/15 [phone]

## 2015-12-01 ENCOUNTER — Encounter: Payer: Self-pay | Admitting: Family Medicine

## 2015-12-01 ENCOUNTER — Ambulatory Visit (INDEPENDENT_AMBULATORY_CARE_PROVIDER_SITE_OTHER): Payer: Worker's Compensation | Admitting: Family Medicine

## 2015-12-01 VITALS — BP 121/83 | HR 73 | Ht 63.0 in | Wt 115.0 lb

## 2015-12-01 DIAGNOSIS — S4992XA Unspecified injury of left shoulder and upper arm, initial encounter: Secondary | ICD-10-CM | POA: Diagnosis not present

## 2015-12-01 MED ORDER — HYDROCODONE-ACETAMINOPHEN 5-325 MG PO TABS: 1.0000 | ORAL_TABLET | Freq: Four times a day (QID) | ORAL | Status: DC | PRN

## 2015-12-01 MED ORDER — CYCLOBENZAPRINE HCL 5 MG PO TABS: 5.0000 mg | ORAL_TABLET | Freq: Three times a day (TID) | ORAL | Status: DC | PRN

## 2015-12-01 MED ORDER — DICLOFENAC SODIUM 75 MG PO TBEC: 75.0000 mg | DELAYED_RELEASE_TABLET | Freq: Two times a day (BID) | ORAL | Status: DC

## 2015-12-01 NOTE — Patient Instructions (Signed)
You at minimum strained your rotator cuff.  I'm concerned about a full thickness tear though with your significantly limited motion and strength. We will order an MRI to assess. Try to avoid painful activities (overhead activities, lifting with extended arm) as much as possible. Voltaren  twice a day with food for pain and inflammation. Norco as needed for severe pain (no driving on this). Flexeril as needed for spasms. Consider physical therapy in the future. See work note for restrictions. Follow up with me in 2 weeks.

## 2015-12-02 ENCOUNTER — Telehealth: Payer: Self-pay | Admitting: Family Medicine

## 2015-12-02 NOTE — Telephone Encounter (Signed)
Spoke to patient and told her that she does not have to use the sling.

## 2015-12-02 NOTE — Telephone Encounter (Signed)
She doesn't have to use the sling - she may feel better without it.

## 2015-12-03 DIAGNOSIS — S4992XA Unspecified injury of left shoulder and upper arm, initial encounter: Secondary | ICD-10-CM | POA: Insufficient documentation

## 2015-12-03 NOTE — Assessment & Plan Note (Signed)
concerning for rotator cuff tear.  Independently reviewed radiographs and no fracture.  Will go ahead with MRI to further assess.  Voltaren with norco and flexeril as needed.  Work note provided.  F/u in 2 weeks.

## 2015-12-03 NOTE — Progress Notes (Signed)
PCP: Tanna Furry, PA-C  Subjective:   HPI: Patient is a 35 y.o. female here for left shoulder injury.  Patient reports on 2/21 she was carrying a long 10 pound box when she felt a pop in left shoulder. Had trouble lifting her left shoulder after this due to pain. Pain has continued at 6/10 level, up to 10/10 and sharp at worst. Has been icing, taking flexeril, vicodin. Using a sling. Radiographs negative for fracture. + night pain. No prior injuries to this shoulder.  Past Medical History  Diagnosis Date  . Back pain, chronic   . Degenerative disc disease, lumbar     No current outpatient prescriptions on file prior to visit.   No current facility-administered medications on file prior to visit.    No past surgical history on file.  Allergies  Allergen Reactions  . Flomax [Tamsulosin Hcl]   . Ibuprofen Swelling  . Iron   . Sulfa Antibiotics Nausea And Vomiting  . Toradol [Ketorolac Tromethamine] Swelling  . Tramadol Nausea And Vomiting    Social History   Social History  . Marital Status: Single    Spouse Name: N/A  . Number of Children: N/A  . Years of Education: N/A   Occupational History  . Not on file.   Social History Main Topics  . Smoking status: Never Smoker   . Smokeless tobacco: Not on file  . Alcohol Use: No  . Drug Use: Not on file  . Sexual Activity: Not on file   Other Topics Concern  . Not on file   Social History Narrative    No family history on file.  BP 121/83 mmHg  Pulse 73  Ht  (1.6 m)  Wt 115 lb (52.164 kg)  BMI 20.38 kg/m2  LMP 11/24/2015  Review of Systems: See HPI above.    Objective:  Physical Exam:  Gen: NAD, comfortable in exam room  Left shoulder: No swelling, ecchymoses.  No gross deformity. No TTP. Full ER.  Abduction and flexion to 50 degrees, painful. Positive Hawkins, Neers. Negative Speeds, Yergasons. Strength 5/5 with resisted internal rotation, 4/5 ER.  Unable to position for empty  can. NV intact distally.  Right shoulder: FROM without pain.    Assessment & Plan:  1. Left shoulder injury - concerning for rotator cuff tear.  Independently reviewed radiographs and no fracture.  Will go ahead with MRI to further assess.  Voltaren with norco and flexeril as needed.  Work note provided.  F/u in 2 weeks.

## 2015-12-04 ENCOUNTER — Encounter: Payer: Self-pay | Admitting: Family Medicine

## 2015-12-04 ENCOUNTER — Telehealth: Payer: Self-pay | Admitting: Family Medicine

## 2015-12-04 NOTE — Telephone Encounter (Signed)
Just ask her to come by today if she can so I can take a look at her.  Thanks!

## 2015-12-15 ENCOUNTER — Encounter: Payer: Self-pay | Admitting: Family Medicine

## 2015-12-15 ENCOUNTER — Ambulatory Visit (INDEPENDENT_AMBULATORY_CARE_PROVIDER_SITE_OTHER): Payer: Worker's Compensation | Admitting: Family Medicine

## 2015-12-15 VITALS — BP 128/78 | HR 76 | Ht 63.0 in | Wt 115.0 lb

## 2015-12-15 DIAGNOSIS — S4992XA Unspecified injury of left shoulder and upper arm, initial encounter: Secondary | ICD-10-CM | POA: Diagnosis not present

## 2015-12-15 DIAGNOSIS — S4992XD Unspecified injury of left shoulder and upper arm, subsequent encounter: Secondary | ICD-10-CM

## 2015-12-15 MED ORDER — HYDROCODONE-ACETAMINOPHEN 5-325 MG PO TABS: 1.0000 | ORAL_TABLET | Freq: Four times a day (QID) | ORAL | Status: DC | PRN

## 2015-12-15 NOTE — Patient Instructions (Signed)
You at minimum strained your rotator cuff.  I'm concerned about a full thickness tear though with your significantly limited motion and strength. Call me 2 days after you have the MRI if you haven't heard from me. Try to avoid painful activities (overhead activities, lifting with extended arm) as much as possible. Voltaren 75mg  twice a day with food for pain and inflammation. Norco as needed for severe pain (no driving on this). Flexeril as needed for spasms. Consider physical therapy in the future. Continue current work restrictions. Follow up will depend on the MRI results.

## 2015-12-17 NOTE — Assessment & Plan Note (Signed)
concerning for rotator cuff tear.  Awaiting word on her getting MRI.  Independently reviewed radiographs last visit and no fracture.  Voltaren with norco and flexeril as needed.  Work note provided.

## 2015-12-17 NOTE — Progress Notes (Addendum)
PCP: Tanna FurryHOUT, BRITTANY, PA-C  Subjective:   HPI: Patient is a 35 y.o. female here for left shoulder injury.  2/27: Patient reports on 2/21 she was carrying a long 10 pound box when she felt a pop in left shoulder. Had trouble lifting her left shoulder after this due to pain. Pain has continued at 6/10 level, up to 10/10 and sharp at worst. Has been icing, taking flexeril, vicodin. Using a sling. Radiographs negative for fracture. + night pain. No prior injuries to this shoulder.  3/13: Patient reports pain level is still 5/10. Taking norco as needed, uses sling at home. Has not heard about MRI yet. No skin changes, fever, other complaints.  Past Medical History  Diagnosis Date  . Back pain, chronic   . Degenerative disc disease, lumbar     Current Outpatient Prescriptions on File Prior to Visit  Medication Sig Dispense Refill  . cyclobenzaprine (FLEXERIL) 5 MG tablet Take 1 tablet (5 mg total) by mouth 3 (three) times daily as needed for muscle spasms. 60 tablet 1  . diclofenac (VOLTAREN) 75 MG EC tablet Take 1 tablet (75 mg total) by mouth 2 (two) times daily. 60 tablet 1  . gabapentin (NEURONTIN) 300 MG capsule TK 1 C PO TID  0  . SUMAtriptan (IMITREX) 100 MG tablet TK 1 T PO  AT ONSET OF HA. MAY REPEAT IN 2 HOURS IN NEEDED BUT NO MORE THAN 2 TS IN 24 HOURS  6   No current facility-administered medications on file prior to visit.    No past surgical history on file.  Allergies  Allergen Reactions  . Flomax [Tamsulosin Hcl]   . Ibuprofen Swelling  . Iron   . Sulfa Antibiotics Nausea And Vomiting  . Toradol [Ketorolac Tromethamine] Swelling  . Tramadol Nausea And Vomiting    Social History   Social History  . Marital Status: Single    Spouse Name: N/A  . Number of Children: N/A  . Years of Education: N/A   Occupational History  . Not on file.   Social History Main Topics  . Smoking status: Never Smoker   . Smokeless tobacco: Not on file  . Alcohol Use: No   . Drug Use: Not on file  . Sexual Activity: Not on file   Other Topics Concern  . Not on file   Social History Narrative    No family history on file.  BP 128/78 mmHg  Pulse 76  Ht 5\' 3"  (1.6 m)  Wt 115 lb (52.164 kg)  BMI 20.38 kg/m2  LMP 11/24/2015  Review of Systems: See HPI above.    Objective:  Physical Exam:  Gen: NAD, comfortable in exam room  Left shoulder: No swelling, ecchymoses.  No gross deformity. No TTP. Full ER.  Abduction and flexion to 50 degrees, painful. Positive Hawkins, Neers. Negative Speeds, Yergasons. Strength 5/5 with resisted internal rotation, 4/5 ER.  Unable to position for empty can. NV intact distally.  Right shoulder: FROM without pain.    Assessment & Plan:  1. Left shoulder injury - concerning for rotator cuff tear.  Awaiting word on her getting MRI.  Independently reviewed radiographs last visit and no fracture.  Voltaren with norco and flexeril as needed.  Work note provided.  Addendum:  MRI reviewed and discussed with patient.  Overall looks good - some infraspinatus tendinosis.  Has a possible small posterior inferior labral tear - if present would expect this to do well with physical therapy.  If not improving recommendation  is to consider MR arthrogram, possible arthroscopy but only a minority of patients would need this.  F/u in 4-6 weeks after starting physical therapy.

## 2015-12-23 ENCOUNTER — Telehealth: Payer: Self-pay | Admitting: Family Medicine

## 2015-12-25 NOTE — Telephone Encounter (Signed)
Results given - plan to start physical therapy, see her back in 4-6 weeks - see addendum to her note.

## 2015-12-25 NOTE — Addendum Note (Signed)
Addended by: Kathi SimpersWISE, Skyra Crichlow F on: 12/25/2015 08:56 AM   Modules accepted: Orders

## 2015-12-26 ENCOUNTER — Encounter: Payer: Self-pay | Admitting: Family Medicine

## 2016-01-05 ENCOUNTER — Telehealth: Payer: Self-pay | Admitting: Family Medicine

## 2016-01-05 NOTE — Telephone Encounter (Signed)
We can't refill that and she's allergic to tramadol which is what we usually step down to.  We could do a different prescription anti-inflammatory instead of the voltaren (like meloxicam).  She could take this with tylenol and a topical medicine like capsaicin all together.

## 2016-01-06 ENCOUNTER — Telehealth: Payer: Self-pay | Admitting: Family Medicine

## 2016-01-06 NOTE — Telephone Encounter (Signed)
Has she started physical therapy yet?  It'd be good to know so I know when I want to see her back and how long to write the note out for.  The current one goes through 4/10.

## 2016-01-12 ENCOUNTER — Encounter: Payer: Self-pay | Admitting: Family Medicine

## 2016-01-12 NOTE — Telephone Encounter (Signed)
She has been going to PT for 1 week - note written for current restrictions to be extended 2 more weeks and will see her back then.

## 2016-01-14 ENCOUNTER — Encounter: Payer: Self-pay | Admitting: Family Medicine

## 2016-01-14 ENCOUNTER — Ambulatory Visit (INDEPENDENT_AMBULATORY_CARE_PROVIDER_SITE_OTHER): Payer: Worker's Compensation | Admitting: Family Medicine

## 2016-01-14 VITALS — BP 131/90 | HR 73 | Ht 63.0 in | Wt 124.0 lb

## 2016-01-14 DIAGNOSIS — M542 Cervicalgia: Secondary | ICD-10-CM

## 2016-01-14 DIAGNOSIS — S4992XD Unspecified injury of left shoulder and upper arm, subsequent encounter: Secondary | ICD-10-CM

## 2016-01-14 MED ORDER — PREDNISONE 10 MG PO TABS: ORAL_TABLET | ORAL | Status: DC

## 2016-01-14 NOTE — Patient Instructions (Signed)
Take prednisone as directed until this is gone.  Stop the diclofenac until the day after you've finished the prednisone. Flexeril as needed for spasms. I've extended your restrictions for 4 weeks. Continue physical therapy.

## 2016-01-15 DIAGNOSIS — M542 Cervicalgia: Secondary | ICD-10-CM | POA: Insufficient documentation

## 2016-01-15 NOTE — Progress Notes (Signed)
PCP: Tanna Furry, PA-C  Subjective:   HPI: Patient is a 35 y.o. female here for left shoulder injury.  2/27: Patient reports on 2/21 she was carrying a long 10 pound box when she felt a pop in left shoulder. Had trouble lifting her left shoulder after this due to pain. Pain has continued at 6/10 level, up to 10/10 and sharp at worst. Has been icing, taking flexeril, vicodin. Using a sling. Radiographs negative for fracture. + night pain. No prior injuries to this shoulder.  3/13: Patient reports pain level is still 5/10. Taking norco as needed, uses sling at home. Has not heard about MRI yet. No skin changes, fever, other complaints.  4/12: Patient reports she's been making progress in PT though only gone a few visits to date. Yesterday she developed 10/10 neck pain bilaterally after picking up a back of kids toys at work. Worsened since that time. Pain is sharp. No radiation of pain. No numbness or tingling. No bowel/bladder dysfunction.  Has flexeril to take.  Past Medical History  Diagnosis Date  . Back pain, chronic   . Degenerative disc disease, lumbar     Current Outpatient Prescriptions on File Prior to Visit  Medication Sig Dispense Refill  . cyclobenzaprine (FLEXERIL) 5 MG tablet Take 1 tablet (5 mg total) by mouth 3 (three) times daily as needed for muscle spasms. 60 tablet 1  . diclofenac (VOLTAREN) 75 MG EC tablet Take 1 tablet (75 mg total) by mouth 2 (two) times daily. 60 tablet 1  . gabapentin (NEURONTIN) 300 MG capsule TK 1 C PO TID  0  . HYDROcodone-acetaminophen (NORCO) 5-325 MG tablet Take 1 tablet by mouth every 6 (six) hours as needed for moderate pain. 40 tablet 0  . SUMAtriptan (IMITREX) 100 MG tablet TK 1 T PO  AT ONSET OF HA. MAY REPEAT IN 2 HOURS IN NEEDED BUT NO MORE THAN 2 TS IN 24 HOURS  6   No current facility-administered medications on file prior to visit.    No past surgical history on file.  Allergies  Allergen Reactions  .  Flomax [Tamsulosin Hcl]   . Ibuprofen Swelling  . Iron   . Sulfa Antibiotics Nausea And Vomiting  . Toradol [Ketorolac Tromethamine] Swelling  . Tramadol Nausea And Vomiting    Social History   Social History  . Marital Status: Single    Spouse Name: N/A  . Number of Children: N/A  . Years of Education: N/A   Occupational History  . Not on file.   Social History Main Topics  . Smoking status: Never Smoker   . Smokeless tobacco: Not on file  . Alcohol Use: No  . Drug Use: Not on file  . Sexual Activity: Not on file   Other Topics Concern  . Not on file   Social History Narrative    No family history on file.  BP 131/90 mmHg  Pulse 73  Ht  (1.6 m)  Wt 124 lb (56.246 kg)  BMI 21.97 kg/m2  Review of Systems: See HPI above.    Objective:  Physical Exam:  Gen: NAD, comfortable in exam room  Neck: Bilateral L > R paraspinal spasms. TTP bilaterally in these areas.  No midline/bony TTP. Neck motion very limited all directions. BUE strength 5/5.   Sensation intact to light touch.   2+ equal reflexes in triceps, biceps, brachioradialis tendons. NV intact distal BUEs.  Left shoulder: No swelling, ecchymoses.  No gross deformity. No TTP. Full ER.  Abduction and flexion to 90 degrees, painful. Positive Hawkins, Neers. Negative Speeds, Yergasons. Strength 5/5 with resisted internal rotation, empty can, 4/5 ER.  Pain empty can NV intact distally.  Right shoulder: FROM without pain.    Assessment & Plan:  1. Left shoulder injury - MRI was reassuring - infraspinatus tendinosis with possible small posterior inferior labral tear.  I would expect this to heal with conservative measures though.  She will continue with physical therapy.  Consider injection, nitro patches if not improving.  2. Neck pain - consistent with severe cervical strain.  Start prednisone dose pack, flexeril as needed.  Continue with physical therapy.  F/u in 4 weeks.

## 2016-01-15 NOTE — Assessment & Plan Note (Signed)
MRI was reassuring - infraspinatus tendinosis with possible small posterior inferior labral tear.  I would expect this to heal with conservative measures though.  She will continue with physical therapy.  Consider injection, nitro patches if not improving.

## 2016-01-15 NOTE — Assessment & Plan Note (Signed)
consistent with severe cervical strain.  Start prednisone dose pack, flexeril as needed.  Continue with physical therapy.  F/u in 4 weeks.

## 2016-01-21 ENCOUNTER — Ambulatory Visit: Payer: Worker's Compensation | Admitting: Family Medicine

## 2016-02-04 ENCOUNTER — Encounter: Payer: Self-pay | Admitting: Family Medicine

## 2016-02-04 ENCOUNTER — Ambulatory Visit (INDEPENDENT_AMBULATORY_CARE_PROVIDER_SITE_OTHER): Payer: Worker's Compensation | Admitting: Family Medicine

## 2016-02-04 VITALS — BP 121/79 | HR 85 | Ht 63.0 in | Wt 124.0 lb

## 2016-02-04 DIAGNOSIS — M542 Cervicalgia: Secondary | ICD-10-CM

## 2016-02-04 DIAGNOSIS — S4992XD Unspecified injury of left shoulder and upper arm, subsequent encounter: Secondary | ICD-10-CM

## 2016-02-04 NOTE — Assessment & Plan Note (Signed)
consistent with severe cervical strain.  Resolved.

## 2016-02-04 NOTE — Progress Notes (Signed)
PCP: Tanna FurryHOUT, BRITTANY, PA-C  Subjective:   HPI: Patient is a 35 y.o. female here for left shoulder injury.  2/27: Patient reports on 2/21 she was carrying a long 10 pound box when she felt a pop in left shoulder. Had trouble lifting her left shoulder after this due to pain. Pain has continued at 6/10 level, up to 10/10 and sharp at worst. Has been icing, taking flexeril, vicodin. Using a sling. Radiographs negative for fracture. + night pain. No prior injuries to this shoulder.  3/13: Patient reports pain level is still 5/10. Taking norco as needed, uses sling at home. Has not heard about MRI yet. No skin changes, fever, other complaints.  4/12: Patient reports she's been making progress in PT though only gone a few visits to date. Yesterday she developed 10/10 neck pain bilaterally after picking up a back of kids toys at work. Worsened since that time. Pain is sharp. No radiation of pain. No numbness or tingling. No bowel/bladder dysfunction.  Has flexeril to take.  5/3: Patient reports she feels much better. Still with some pain raising arm out to the side laterally. Not taking any medicines now. Doing well with physical therapy and home exercises. Neck has improved completely. No bowel/bladder dysfunction. No numbness.  Past Medical History  Diagnosis Date  . Back pain, chronic   . Degenerative disc disease, lumbar     Current Outpatient Prescriptions on File Prior to Visit  Medication Sig Dispense Refill  . cyclobenzaprine (FLEXERIL) 5 MG tablet Take 1 tablet (5 mg total) by mouth 3 (three) times daily as needed for muscle spasms. 60 tablet 1  . diclofenac (VOLTAREN) 75 MG EC tablet Take 1 tablet (75 mg total) by mouth 2 (two) times daily. 60 tablet 1  . gabapentin (NEURONTIN) 300 MG capsule TK 1 C PO TID  0  . HYDROcodone-acetaminophen (NORCO) 5-325 MG tablet Take 1 tablet by mouth every 6 (six) hours as needed for moderate pain. 40 tablet 0  . predniSONE  (DELTASONE) 10 MG tablet 6 tabs po day 1, 5 tabs po day 2, 4 tabs po day 3, 3 tabs po day 4, 2 tabs po day 5, 1 tab po day 6 21 tablet 0  . SUMAtriptan (IMITREX) 100 MG tablet TK 1 T PO  AT ONSET OF HA. MAY REPEAT IN 2 HOURS IN NEEDED BUT NO MORE THAN 2 TS IN 24 HOURS  6   No current facility-administered medications on file prior to visit.    No past surgical history on file.  Allergies  Allergen Reactions  . Flomax [Tamsulosin Hcl]   . Ibuprofen Swelling  . Iron   . Sulfa Antibiotics Nausea And Vomiting  . Toradol [Ketorolac Tromethamine] Swelling  . Tramadol Nausea And Vomiting    Social History   Social History  . Marital Status: Single    Spouse Name: N/A  . Number of Children: N/A  . Years of Education: N/A   Occupational History  . Not on file.   Social History Main Topics  . Smoking status: Never Smoker   . Smokeless tobacco: Not on file  . Alcohol Use: No  . Drug Use: Not on file  . Sexual Activity: Not on file   Other Topics Concern  . Not on file   Social History Narrative    No family history on file.  BP 121/79 mmHg  Pulse 85  Ht 5\' 3"  (1.6 m)  Wt 124 lb (56.246 kg)  BMI 21.97 kg/m2  Review of Systems: See HPI above.    Objective:  Physical Exam:  Gen: NAD, comfortable in exam room  Neck: No gross deformity, swelling, bruising. No TTP .  No midline/bony TTP. FROM neck without pain . BUE strength 5/5.   NV intact distal BUEs.  Left shoulder: No swelling, ecchymoses.  No gross deformity. No TTP. FROM with painful arc. Negative Hawkins, Neers. Negative Speeds, Yergasons. Strength 5/5 with resisted internal rotation, empty can, ER.  Pain empty can NV intact distally.  Right shoulder: FROM without pain.    Assessment & Plan:  1. Left shoulder injury - MRI was reassuring - infraspinatus tendinosis with possible small posterior inferior labral tear.  Clinically improving with conservative treatment - continue with physical therapy,  home exercises.  F/u in 4 weeks.  Start light duty with restrictions for 2 weeks then full duty.  Flexeril if needed.  2. Neck pain - consistent with severe cervical strain.  Resolved.

## 2016-02-04 NOTE — Assessment & Plan Note (Signed)
MRI was reassuring - infraspinatus tendinosis with possible small posterior inferior labral tear.  Clinically improving with conservative treatment - continue with physical therapy, home exercises.  F/u in 4 weeks.  Start light duty with restrictions for 2 weeks then full duty.  Flexeril if needed.

## 2016-02-05 ENCOUNTER — Ambulatory Visit: Payer: Self-pay | Admitting: Family Medicine

## 2016-03-03 ENCOUNTER — Ambulatory Visit: Payer: Worker's Compensation | Admitting: Family Medicine

## 2016-03-11 ENCOUNTER — Ambulatory Visit: Payer: Worker's Compensation | Admitting: Family Medicine

## 2016-04-05 ENCOUNTER — Ambulatory Visit: Payer: Worker's Compensation | Admitting: Family Medicine

## 2017-05-24 ENCOUNTER — Encounter (HOSPITAL_BASED_OUTPATIENT_CLINIC_OR_DEPARTMENT_OTHER): Payer: Self-pay

## 2017-05-24 ENCOUNTER — Emergency Department (HOSPITAL_BASED_OUTPATIENT_CLINIC_OR_DEPARTMENT_OTHER)
Admission: EM | Admit: 2017-05-24 | Discharge: 2017-05-24 | Disposition: A | Discharge status: Home / Self Care | Payer: Medicaid Other | Attending: Emergency Medicine | Admitting: Emergency Medicine

## 2017-05-24 DIAGNOSIS — M5416 Radiculopathy, lumbar region: Secondary | ICD-10-CM

## 2017-05-24 DIAGNOSIS — M545 Low back pain, unspecified: Secondary | ICD-10-CM

## 2017-05-24 DIAGNOSIS — Z79899 Other long term (current) drug therapy: Secondary | ICD-10-CM | POA: Diagnosis not present

## 2017-05-24 MED ORDER — PREDNISONE 10 MG (21) PO TBPK: ORAL_TABLET | Freq: Every day | ORAL | 0 refills | Status: DC

## 2017-05-24 MED ORDER — HYDROMORPHONE HCL 1 MG/ML IJ SOLN
1.0000 mg | Freq: Once | INTRAMUSCULAR | Status: AC
  Administered 2017-05-24: 1 mg via INTRAMUSCULAR
  Filled 2017-05-24: qty 1

## 2017-05-24 MED ORDER — DEXAMETHASONE SODIUM PHOSPHATE 4 MG/ML IJ SOLN
10.0000 mg | Freq: Once | INTRAMUSCULAR | Status: AC
Start: 2017-05-24 — End: 2017-05-24
  Administered 2017-05-24: 10 mg via INTRAMUSCULAR
  Filled 2017-05-24: qty 3

## 2017-05-24 MED FILL — predniSONE 10 MG TABS: 10 | 12 days supply | Qty: 42 | Fill #0

## 2017-05-24 NOTE — ED Notes (Signed)
Pt appeared angry when she entered triage-answering ?s with short quick responses-pt became angry during med review when I asked if she was in pain management-stating that she is in a pain management program-did not offer info until asked

## 2017-05-24 NOTE — ED Triage Notes (Signed)
C/o chronic back pain-worse x 1-2 weeks-NAD-steady gait

## 2017-05-24 NOTE — ED Provider Notes (Signed)
[facility] Provider [facility]   CSN: [id number] Arrival date & time: 05/24/17  1259     History   Chief Complaint Chief Complaint  Patient presents with  . Back Pain    HPI Kaitlin Perez is a 36 y.o. female.  HPI   36 year old female with past medical history of chronic lower back pain here with acute on chronic back pain. The patient states that she recently underwent trigger point injections last week. She denies any improvement in her pain since then. She reports worsening lower back pain that radiates towards her right groin as well as down the right side of her leg to her foot. She has a tingling sensation along her second, third, and fourth digits on the right foot. She states that her tingling and leg pain are chronic in nature, only worsened, but the groin pain has only been there several times in the past, as opposed to always. She denies any recent falls or trauma. No fevers or chills. No recent epidural or lumbar injections. Denies any lower extremities weakness. No difficulty ambulating. No loss of bowel or bladder function.  Past Medical History:  Diagnosis Date  . Back pain, chronic   . Degenerative disc disease, lumbar     Patient Active Problem List   Diagnosis Date Noted  . Neck pain 01/15/2016  . Injury of left shoulder 12/03/2015  . Transient alteration of awareness 02/13/2014    Past Surgical History:  Procedure Laterality Date  . TUBAL LIGATION      OB History    No data available       Home Medications    Prior to Admission medications   Medication Sig Start Date End Date Taking? Authorizing Provider  oxyCODONE-acetaminophen (PERCOCET/ROXICET) 5-325 MG tablet Take by mouth every 4 (four) hours as needed for severe pain.   Yes [provider]  tiZANidine (ZANAFLEX) 4 MG capsule Take 4 mg by mouth 3 (three) times daily.   Yes [provider]  gabapentin (NEURONTIN) 300 MG capsule TK 1 C PO TID 10/21/15   [provider]  predniSONE (STERAPRED UNI-PAK 21 TAB) 10 MG (21) TBPK tablet Take by mouth daily. Take 6 tabs by mouth daily  for 2 days, then 5 tabs for 2 days, then 4 tabs for 2 days, then 3 tabs for 2 days, 2 tabs for 2 days, then 1 tab by mouth daily for 2 days 05/24/17   Shaune Pollack, MD    Family History No family history on file.  Social History Social History  Substance Use Topics  . Smoking status: Never Smoker  . Smokeless tobacco: Never Used  . Alcohol use No     Allergies   Flomax [tamsulosin hcl]; Ibuprofen; Iron; Sulfa antibiotics; and Toradol [ketorolac tromethamine]   Review of Systems Review of Systems  Constitutional: Negative for chills and fever.  HENT: Negative for congestion, rhinorrhea and sore throat.   Eyes: Negative for visual disturbance.  Respiratory: Negative for cough, shortness of breath and wheezing.   Cardiovascular: Negative for chest pain and leg swelling.  Gastrointestinal: Negative for abdominal pain, diarrhea, nausea and vomiting.  Genitourinary: Negative for dysuria, flank pain, vaginal bleeding and vaginal discharge.  Musculoskeletal: Positive for back pain and gait problem. Negative for neck pain.  Skin: Negative for rash.  Allergic/Immunologic: Negative for immunocompromised state.  Neurological: Positive for numbness. Negative for syncope and headaches.  Hematological: Does not bruise/bleed easily.  All other systems reviewed and are negative.  Physical Exam Updated Vital Signs BP 133/89 (BP Location: Left Arm)   Pulse 85   Temp 98.2 F (36.8 C) (Oral)   Resp 18   Ht 5\' 3"  (1.6 m)   Wt 62.6 kg (138 lb)   LMP 05/10/2017   SpO2 100%   BMI 24.45 kg/m   Physical Exam  Constitutional: She is oriented to person, place, and time. She appears well-developed and well-nourished. No distress.  HENT:  Head: Normocephalic and atraumatic.  Eyes: Conjunctivae are normal.  Neck: Neck supple.  Cardiovascular: Normal rate, regular  rhythm and normal heart sounds.  Exam reveals no friction rub.   No murmur heard. Pulmonary/Chest: Effort normal and breath sounds normal. No respiratory distress. She has no wheezes. She has no rales.  Abdominal: She exhibits no distension.  Musculoskeletal: She exhibits no edema.  Neurological: She is alert and oriented to person, place, and time. She exhibits normal muscle tone.  Skin: Skin is warm. Capillary refill takes less than 2 seconds.  Psychiatric: She has a normal mood and affect.  Nursing note and vitals reviewed.   Spine Exam: Inspection/Palpation: Exquisite tenderness over lower right paraspinal lumbar space. No bruising or deformity. Strength: 5/5 throughout LE bilaterally (hip flexion/extension, adduction/abduction; knee flexion/extension; foot dorsiflexion/plantarflexion, inversion/eversion; great toe inversion) Sensation: Intact to light touch in proximal and distal LE bilaterally. She has subjectively diminished along the dorsal aspect of the second, third, and fourth toes on right Reflexes: 1+ quadriceps and achilles reflexes on the right, 2+ left   ED Treatments / Results  Labs (all labs ordered are listed, but only abnormal results are displayed) Labs Reviewed - No data to display  EKG  EKG Interpretation None       Radiology No results found.  Procedures Procedures (including critical care time)  Medications Ordered in ED Medications  HYDROmorphone (DILAUDID) injection 1 mg (1 mg Intramuscular Given 05/24/17 1434)  dexamethasone (DECADRON) injection 10 mg (10 mg Intramuscular Given 05/24/17 1432)     Initial Impression / Assessment and Plan / ED Course  I have reviewed the triage vital signs and the nursing notes.  Pertinent labs & imaging results that were available during my care of the patient were reviewed by me and considered in my medical decision making (see chart for details).     36 year old female with history of chronic back pain here  with acute on chronic pain. No recent falls. No loss of bowel or bladder function, fever, history of IV drug use, history of recent instrumentation, or evidence is chest osteo-myelitis or pruritus. There is no evidence of some cauda equina. She does have symptoms consistent with worsening lumbar radiculopathy and I will give her steroids for this. We will also advise her to continue her home pain medications, which are prescribed by a pain clinic. Otherwise, no acute red flags. Given recent MRI with no recent falls or trauma, do not feel repeat imaging is indicated. She will follow up with her pain clinic and spine specialist. No pain of the actual abdomen, pelvis/perineum.  This note was prepared with assistance of Conservation officer, historic buildings. Occasional wrong-word or sound-a-like substitutions may have occurred due to the inherent limitations of voice recognition software.   Final Clinical Impressions(s) / ED Diagnoses   Final diagnoses:  Acute right-sided low back pain without sciatica  Lumbar radiculopathy    New Prescriptions Discharge Medication List as of 05/24/2017  2:39 PM    START taking these medications   Details  predniSONE (STERAPRED  UNI-PAK 21 TAB) 10 MG (21) TBPK tablet Take by mouth daily. Take 6 tabs by mouth daily  for 2 days, then 5 tabs for 2 days, then 4 tabs for 2 days, then 3 tabs for 2 days, 2 tabs for 2 days, then 1 tab by mouth daily for 2 days, Starting Tue 05/24/2017, Print         Shaune Pollack, MD 05/24/17 1550

## 2021-01-07 ENCOUNTER — Emergency Department (HOSPITAL_BASED_OUTPATIENT_CLINIC_OR_DEPARTMENT_OTHER): Payer: Medicaid Other

## 2021-01-07 ENCOUNTER — Other Ambulatory Visit: Payer: Self-pay

## 2021-01-07 ENCOUNTER — Emergency Department (HOSPITAL_BASED_OUTPATIENT_CLINIC_OR_DEPARTMENT_OTHER)
Admission: EM | Admit: 2021-01-07 | Discharge: 2021-01-07 | Disposition: A | Discharge status: Home / Self Care | Payer: Medicaid Other | Attending: Emergency Medicine | Admitting: Emergency Medicine

## 2021-01-07 ENCOUNTER — Encounter (HOSPITAL_BASED_OUTPATIENT_CLINIC_OR_DEPARTMENT_OTHER): Payer: Self-pay | Admitting: *Deleted

## 2021-01-07 DIAGNOSIS — M542 Cervicalgia: Secondary | ICD-10-CM | POA: Diagnosis present

## 2021-01-07 DIAGNOSIS — M25512 Pain in left shoulder: Secondary | ICD-10-CM

## 2021-01-07 DIAGNOSIS — M5412 Radiculopathy, cervical region: Secondary | ICD-10-CM | POA: Diagnosis not present

## 2021-01-07 DIAGNOSIS — M62838 Other muscle spasm: Secondary | ICD-10-CM

## 2021-01-07 LAB — PREGNANCY, URINE: Preg Test, Ur: NEGATIVE

## 2021-01-07 MED ORDER — ACETAMINOPHEN 500 MG PO TABS
1000.0000 mg | ORAL_TABLET | Freq: Once | ORAL | Status: AC
  Administered 2021-01-07: 1000 mg via ORAL
  Filled 2021-01-07: qty 2

## 2021-01-07 MED ORDER — LIDOCAINE 5 % EX PTCH: 1.0000 | MEDICATED_PATCH | CUTANEOUS | 0 refills | Status: AC

## 2021-01-07 MED ORDER — PREDNISONE 10 MG PO TABS: 40.0000 mg | ORAL_TABLET | Freq: Every day | ORAL | 0 refills | Status: AC

## 2021-01-07 MED ORDER — LIDOCAINE 5 % EX PTCH
1.0000 | MEDICATED_PATCH | CUTANEOUS | Status: DC
  Administered 2021-01-07: 1 via TRANSDERMAL
  Filled 2021-01-07: qty 1

## 2021-01-07 MED ORDER — DIAZEPAM 5 MG PO TABS
5.0000 mg | ORAL_TABLET | Freq: Once | ORAL | Status: AC
  Administered 2021-01-07: 5 mg via ORAL
  Filled 2021-01-07: qty 1

## 2021-01-07 MED ORDER — CYCLOBENZAPRINE HCL 10 MG PO TABS: 10.0000 mg | ORAL_TABLET | Freq: Two times a day (BID) | ORAL | 0 refills | Status: AC | PRN

## 2021-01-07 NOTE — ED Triage Notes (Signed)
C/o left shoulder injury x 4 hrs ago

## 2021-01-07 NOTE — ED Provider Notes (Signed)
[facility] EMERGENCY DEPARTMENT Provider Note   CSN: [id number] Arrival date & time: 01/07/21  1629     History Chief Complaint  Patient presents with  . Shoulder Injury    Kaitlin Perez is a 40 y.o. female.  HPI      40 year old female with history of degenerative disc disease, chronic back pain for which she sees chronic pain management, reports she is being evaluated for neck pain and cervical radiculopathy and has an MRI pending of her neck, also on chart review was diagnosed with adhesive capsulitis of the left shoulder, who presents with concern for acute left shoulder pain.  Reports that she was trying to stuff the fiber into a pillowcase with her left arm when she suddenly felt a pop in her upper left shoulder, and had severe pain.  Reports she this occurred 4 hours ago.  Reports severe pain with tingling going down her left arm.  Reports that the whole left arm is tingling.  Denies weakness, symptoms in her legs.  Denies numbness, weakness in face, no other difficulty speaking, change in vision, difficulty walking. No fevers.  No other chest pain or shortness of breath.  Reports that she does not have an orthopedic or sports medicine doctor.  She tried using her home oxycodone without relief.  Past Medical History:  Diagnosis Date  . Back pain, chronic   . Degenerative disc disease, lumbar     Patient Active Problem List   Diagnosis Date Noted  . Neck pain 01/15/2016  . Injury of left shoulder 12/03/2015  . Transient alteration of awareness 02/13/2014    Past Surgical History:  Procedure Laterality Date  . TUBAL LIGATION       OB History   No obstetric history on file.     No family history on file.  Social History   Tobacco Use  . Smoking status: Never Smoker  . Smokeless tobacco: Never Used  Vaping Use  . Vaping Use: Every day  Substance Use Topics  . Alcohol use: No    Alcohol/week: 0.0 standard drinks    Home Medications Prior to  Admission medications   Medication Sig Start Date End Date Taking? Authorizing Provider  cyclobenzaprine (FLEXERIL) 10 MG tablet Take 1 tablet (10 mg total) by mouth 2 (two) times daily as needed for muscle spasms. 01/07/21  Yes Alvira Monday, MD  lidocaine (LIDODERM) 5 % Place 1 patch onto the skin daily. Remove & Discard patch within 12 hours or as directed by MD 01/07/21  Yes Alvira Monday, MD  predniSONE (DELTASONE) 10 MG tablet Take 4 tablets (40 mg total) by mouth daily for 4 days. 01/07/21 01/11/21 Yes Alvira Monday, MD  gabapentin (NEURONTIN) 300 MG capsule TK 1 C PO TID 10/21/15   [provider]  oxyCODONE-acetaminophen (PERCOCET/ROXICET) 5-325 MG tablet Take by mouth every 4 (four) hours as needed for severe pain.    [provider]  tiZANidine (ZANAFLEX) 4 MG capsule Take 4 mg by mouth 3 (three) times daily.    [provider]    Allergies    Flomax [tamsulosin hcl], Ibuprofen, Iron, Sulfa antibiotics, and Toradol [ketorolac tromethamine]  Review of Systems   Review of Systems  Constitutional: Negative for fever.  Eyes: Negative for visual disturbance.  Respiratory: Negative for shortness of breath.   Cardiovascular: Negative for chest pain.  Gastrointestinal: Negative for abdominal pain.  Genitourinary: Negative for difficulty urinating.  Musculoskeletal: Positive for arthralgias, myalgias and neck pain.  Neurological: Positive  for numbness. Negative for dizziness, facial asymmetry, speech difficulty and weakness.    Physical Exam Updated Vital Signs BP (!) 151/92 (BP Location: Right Arm)   Pulse 76   Temp 98.2 F (36.8 C) (Oral)   Resp 16   Ht 5\' 3"  (1.6 m)   Wt 49 kg   LMP 12/10/2020   SpO2 100%   BMI 19.13 kg/m   Physical Exam Vitals and nursing note reviewed.  Constitutional:      General: She is not in acute distress.    Appearance: Normal appearance. She is not ill-appearing, toxic-appearing or diaphoretic.  HENT:     Head:  Normocephalic.  Eyes:     Conjunctiva/sclera: Conjunctivae normal.  Cardiovascular:     Rate and Rhythm: Normal rate and regular rhythm.     Pulses: Normal pulses.  Pulmonary:     Effort: Pulmonary effort is normal. No respiratory distress.  Musculoskeletal:        General: Tenderness (mildine C Spine, left shoulder, more significant tenderness left trapezius muscle and spasm noted) present. No deformity or signs of injury.     Cervical back: No rigidity.  Skin:    General: Skin is warm and dry.     Coloration: Skin is not jaundiced or pale.  Neurological:     General: No focal deficit present.     Mental Status: She is alert and oriented to person, place, and time.     Comments: Normal strength left UE Reports tingling/altered sensation involving all distributions of arm     ED Results / Procedures / Treatments   Labs (all labs ordered are listed, but only abnormal results are displayed) Labs Reviewed  PREGNANCY, URINE    EKG None  Radiology DG Cervical Spine Complete  Result Date: 01/07/2021 CLINICAL DATA:  Left shoulder pain EXAM: CERVICAL SPINE - COMPLETE 4+ VIEW COMPARISON:  None. FINDINGS: Mild degenerative facet disease bilaterally. Disc spaces maintained. Normal alignment. Prevertebral soft tissues are normal. No fracture. IMPRESSION: Mild bilateral degenerative facet disease. No acute bony abnormality. Electronically Signed   By: 03/09/2021 M.D.   On: 01/07/2021 18:15   DG Shoulder Left  Result Date: 01/07/2021 CLINICAL DATA:  Left shoulder pain EXAM: LEFT SHOULDER - 2+ VIEW COMPARISON:  11/27/2015 FINDINGS: There is no evidence of fracture or dislocation. There is no evidence of arthropathy or other focal bone abnormality. Soft tissues are unremarkable. IMPRESSION: Negative. Electronically Signed   By: 11/29/2015 M.D.   On: 01/07/2021 18:15    Procedures Procedures   Medications Ordered in ED Medications  acetaminophen (TYLENOL) tablet 1,000 mg (1,000 mg  Oral Given 01/07/21 1659)  diazepam (VALIUM) tablet 5 mg (5 mg Oral Given 01/07/21 1702)    ED Course  I have reviewed the triage vital signs and the nursing notes.  Pertinent labs & imaging results that were available during my care of the patient were reviewed by me and considered in my medical decision making (see chart for details).    MDM Rules/Calculators/A&P                          40 year old female with history of degenerative disc disease, chronic back pain for which she sees chronic pain management, reports she is being evaluated for neck pain and cervical radiculopathy and has an MRI pending of her neck, also on chart review was diagnosed with adhesive capsulitis of the left shoulder, who presents with concern for acute left shoulder  pain.  She has normal bilateral upper extremity pulses, no sign of acute arterial thrombus, low suspicion for aortic dissection by history and physical exam and doubt vascular abnormality leading to symptoms.  History and physical exam are not consistent with intra-abdominal or intrathoracic etiology of pain.  Doubt intracranial etiology given significant pain component, tenderness, symptoms limited to LUE.   She is being worked up for a cervical radiculopathy as an outpatient, consider this as a possible cause of her acute worsening.  She has no weakness on exam, and tingling does not follow a particular nerve distribution, she has no fever, and do not feel she has a condition that indicates emergent surgery or transfer for MRI.   XR obtained show no acute fx or abnormalities.  Her trapezius muscle does appear to be in spasm on exam. Given valium, lidoderm patch.  DDx continues to include muscle spasm, circular cervical radiculopathy, rotator cuff or labrum abnormality.  Given sling and recommend range of motion exercises and follow-up with sports medicine as an outpatient. Patient discharged in stable condition with understanding of reasons to return.     Final Clinical Impression(s) / ED Diagnoses Final diagnoses:  Acute pain of left shoulder  Trapezius muscle spasm  Cervical radiculopathy    Rx / DC Orders ED Discharge Orders         Ordered    predniSONE (DELTASONE) 10 MG tablet  Daily        01/07/21 1900    cyclobenzaprine (FLEXERIL) 10 MG tablet  2 times daily PRN        01/07/21 1900    lidocaine (LIDODERM) 5 %  Every 24 hours        01/07/21 1900           Alvira Monday, MD 01/08/21 1200

## 2022-11-26 ENCOUNTER — Other Ambulatory Visit: Payer: Self-pay

## 2022-11-26 ENCOUNTER — Encounter (HOSPITAL_BASED_OUTPATIENT_CLINIC_OR_DEPARTMENT_OTHER): Payer: Self-pay | Admitting: Emergency Medicine

## 2022-11-26 ENCOUNTER — Emergency Department (HOSPITAL_BASED_OUTPATIENT_CLINIC_OR_DEPARTMENT_OTHER): Payer: Medicaid Other

## 2022-11-26 ENCOUNTER — Emergency Department (HOSPITAL_BASED_OUTPATIENT_CLINIC_OR_DEPARTMENT_OTHER)
Admission: EM | Admit: 2022-11-26 | Discharge: 2022-11-26 | Disposition: A | Discharge status: Home / Self Care | Payer: Medicaid Other | Attending: Emergency Medicine | Admitting: Emergency Medicine

## 2022-11-26 DIAGNOSIS — Y9339 Activity, other involving climbing, rappelling and jumping off: Secondary | ICD-10-CM | POA: Diagnosis not present

## 2022-11-26 DIAGNOSIS — W1840XA Slipping, tripping and stumbling without falling, unspecified, initial encounter: Secondary | ICD-10-CM | POA: Insufficient documentation

## 2022-11-26 DIAGNOSIS — S99911A Unspecified injury of right ankle, initial encounter: Secondary | ICD-10-CM | POA: Diagnosis present

## 2022-11-26 DIAGNOSIS — S93491A Sprain of other ligament of right ankle, initial encounter: Secondary | ICD-10-CM | POA: Diagnosis not present

## 2022-11-26 DIAGNOSIS — S93401A Sprain of unspecified ligament of right ankle, initial encounter: Secondary | ICD-10-CM

## 2022-11-26 NOTE — ED Triage Notes (Signed)
Right foot and ankle injury while was jumping 3 days ago , she reports edema . Unable to bear weight on the affected limb .

## 2022-11-26 NOTE — Discharge Instructions (Addendum)
Please been on weightbearing, to help prevent further trauma to your ankle, use the the Aircast, to help provide support, and use the crutches as much as possible.  Make sure you are elevating the area, using Tylenol for pain control.  If you have loss of color in your foot, you have no pulse in your foot, or pain is severe, please return to the ER.  You can follow-up with your primary care doctor, but if persistent, follow-up with orthopedics.

## 2022-11-26 NOTE — ED Provider Notes (Signed)
[facility] Provider Note   CSN: [id number] Arrival date & time: 11/26/22  1122     History  Chief Complaint  Patient presents with   Foot Injury    right    Kaitlin Perez is a 42 y.o. female, who presents to the ED secondary to right ankle/foot pain has been going on for the last 3 days.  She states on Tuesday, she tripped, and her ankle went out, and she has had pain and difficulty walking on it since then.  She states that it is extremely painful to walk on, and her leg buckles secondary to the pain.  States she has been icing it and using Tylenol, but is concerned that there is something wrong with it.  States that she intermittently gets pain that goes up her leg, that resolves.  Did not hit her head, no confusion no nausea or vomiting.    Home Medications Prior to Admission medications   Medication Sig Start Date End Date Taking? Authorizing Provider  cyclobenzaprine (FLEXERIL) 10 MG tablet Take 1 tablet (10 mg total) by mouth 2 (two) times daily as needed for muscle spasms. 01/07/21   Gareth Morgan, MD  gabapentin (NEURONTIN) 300 MG capsule TK 1 C PO TID 10/21/15   [provider]  lidocaine (LIDODERM) 5 % Place 1 patch onto the skin daily. Remove & Discard patch within 12 hours or as directed by MD 01/07/21   Gareth Morgan, MD  oxyCODONE-acetaminophen (PERCOCET/ROXICET) 5-325 MG tablet Take by mouth every 4 (four) hours as needed for severe pain.    [provider]  tiZANidine (ZANAFLEX) 4 MG capsule Take 4 mg by mouth 3 (three) times daily.    [provider]      Allergies    Flomax [tamsulosin hcl], Ibuprofen, Iron, Sulfa antibiotics, and Toradol [ketorolac tromethamine]    Review of Systems   Review of Systems  Musculoskeletal:  Positive for joint swelling.  Skin:  Negative for wound.    Physical Exam Updated Vital Signs BP 120/75 (BP Location: Right Arm)   Pulse 67   Temp 98.2 F (36.8 C)  (Oral)   Resp 18   Wt 62.6 kg   LMP 11/05/2022 (Approximate)   SpO2 100%   BMI 24.45 kg/m  Physical Exam Vitals and nursing note reviewed.  Constitutional:      General: She is not in acute distress.    Appearance: She is well-developed.  HENT:     Head: Normocephalic and atraumatic.  Eyes:     General:        Right eye: No discharge.        Left eye: No discharge.     Conjunctiva/sclera: Conjunctivae normal.  Pulmonary:     Effort: No respiratory distress.  Musculoskeletal:     Comments: Right ankle: TTP of lateral malleolus and medial aspect of foot. Edema noted to ankle. Not able to bear weight. Able to plantar flex and dorsiflex ankle. Inversion/eversion intact. Negative Thompson test. No midfoot tor base of 5th metatarsal tenderness to palpation. Capillary refill <2sec. Dorsalis pedis pulse present. No foot drop noted. Sensation intact. Warm to touch.    Neurological:     Mental Status: She is alert.     Comments: Clear speech.   Psychiatric:        Behavior: Behavior normal.        Thought Content: Thought content normal.     ED Results / Procedures / Treatments  Labs (all labs ordered are listed, but only abnormal results are displayed) Labs Reviewed - No data to display  EKG None  Radiology DG Foot Complete Right  Result Date: 11/26/2022 CLINICAL DATA:  Jumping injury, pain EXAM: RIGHT FOOT COMPLETE - 3+ VIEW COMPARISON:  11/26/2022 FINDINGS: There is no evidence of fracture or dislocation. There is no evidence of arthropathy or other focal bone abnormality. Soft tissues are unremarkable. IMPRESSION: Negative. Electronically Signed   By: Jerilynn Mages.  Shick M.D.   On: 11/26/2022 11:52   DG Ankle Complete Right  Result Date: 11/26/2022 CLINICAL DATA:  Jumping 3 days ago with pain EXAM: RIGHT ANKLE - COMPLETE 3 VIEW COMPARISON:  None Available. FINDINGS: There is no evidence of fracture, dislocation, or joint effusion. There is no evidence of arthropathy or other focal  bone abnormality. Soft tissues are unremarkable. IMPRESSION: No acute osseous abnormality Electronically Signed   By: Jill Side M.D.   On: 11/26/2022 11:49    Procedures Procedures    Medications Ordered in ED Medications - No data to display  ED Course/ Medical Decision Making/ A&P                             Medical Decision Making Patient is a 42 year old female, here for right ankle pain has been going on for the last 3 days, after she tripped.  No head injury.  She has a good pulse, is tender to palpation along medial aspect of the foot, and lateral malleolus.  Unable to bear weight.  We will obtain x-rays of foot and ankle for further evaluation.  She has a good pulse, thus I think vascular is unlikely.  She also states that she has had some sharp burning pain of her leg, since the incident.  I believe this is likely secondary to the swelling as she has no other neurodeficits.  Amount and/or Complexity of Data Reviewed Radiology: ordered.    Details: X-rays unremarkable Discussion of management or test interpretation with external provider(s): Discussed with patient, likely severe ankle sprain, placed in Aircast, with close follow-up with orthopedics as needed.  We discussed return precautions, and need for follow-up with PCP.  Crutches provided.    Final Clinical Impression(s) / ED Diagnoses Final diagnoses:  Sprain of right ankle, unspecified ligament, initial encounter    Rx / DC Orders ED Discharge Orders     None         Cherina Dhillon L, PA 11/26/22 1308    Leanord Asal K, DO 11/26/22 1428
